# Patient Record
Sex: Female | Born: 1995 | Race: White | Hispanic: No | Marital: Single | Smoking: Never smoker
Health system: Southern US, Community
[De-identification: ages and names within clinical notes are randomized; demographics above are authoritative.]

## PROBLEM LIST (undated history)

## (undated) DIAGNOSIS — F329 Major depressive disorder, single episode, unspecified: Secondary | ICD-10-CM

## (undated) DIAGNOSIS — F909 Attention-deficit hyperactivity disorder, unspecified type: Secondary | ICD-10-CM

## (undated) DIAGNOSIS — F32A Depression, unspecified: Secondary | ICD-10-CM

## (undated) DIAGNOSIS — F4310 Post-traumatic stress disorder, unspecified: Secondary | ICD-10-CM

---
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
## 2017-09-25 ENCOUNTER — Encounter (HOSPITAL_COMMUNITY): Payer: Self-pay | Admitting: Nurse Practitioner

## 2017-09-25 ENCOUNTER — Emergency Department (HOSPITAL_COMMUNITY)
Admission: EM | Admit: 2017-09-25 | Discharge: 2017-09-25 | Disposition: A | Discharge status: Home / Self Care | Payer: Self-pay | Attending: Emergency Medicine | Admitting: Emergency Medicine

## 2017-09-25 DIAGNOSIS — R251 Tremor, unspecified: Secondary | ICD-10-CM | POA: Insufficient documentation

## 2017-09-25 LAB — COMPREHENSIVE METABOLIC PANEL
ALT: 13 U/L — AB (ref 14–54)
AST: 20 U/L (ref 15–41)
Albumin: 4.6 g/dL (ref 3.5–5.0)
Alkaline Phosphatase: 65 U/L (ref 38–126)
Anion gap: 7 (ref 5–15)
BILIRUBIN TOTAL: 0.6 mg/dL (ref 0.3–1.2)
BUN: 7 mg/dL (ref 6–20)
CHLORIDE: 106 mmol/L (ref 101–111)
CO2: 25 mmol/L (ref 22–32)
CREATININE: 0.69 mg/dL (ref 0.44–1.00)
Calcium: 9.3 mg/dL (ref 8.9–10.3)
Glucose, Bld: 84 mg/dL (ref 65–99)
Potassium: 3.9 mmol/L (ref 3.5–5.1)
SODIUM: 138 mmol/L (ref 135–145)
TOTAL PROTEIN: 7.8 g/dL (ref 6.5–8.1)

## 2017-09-25 LAB — CBC
HCT: 43.2 % (ref 36.0–46.0)
Hemoglobin: 14.6 g/dL (ref 12.0–15.0)
MCH: 31.3 pg (ref 26.0–34.0)
MCHC: 33.8 g/dL (ref 30.0–36.0)
MCV: 92.5 fL (ref 78.0–100.0)
PLATELETS: 210 10*3/uL (ref 150–400)
RBC: 4.67 MIL/uL (ref 3.87–5.11)
RDW: 12.9 % (ref 11.5–15.5)
WBC: 7.3 10*3/uL (ref 4.0–10.5)

## 2017-09-25 NOTE — ED Triage Notes (Signed)
Pt states she feels shaky especially her arms and her left side of her jaw. Denies any medical hx.

## 2017-09-25 NOTE — ED Provider Notes (Signed)
[facility]-EMERGENCY DEPT Provider Note   CSN: [id number] Arrival date & time: 09/25/17  1833     History   Chief Complaint Chief Complaint  Patient presents with  . Feels Shaky    HPI Alexandria DresserKristin [name] is a 21 y.o. female.  21 yo F with a chief complaint of feeling unwell.  The patient has had multiple episodes of this in the past.  They usually resolve spontaneously within about 20 minutes or so.  This 1 was more prolonged.  She suddenly felt bad felt like she could not breathe and started having pain to her left forearm and her left jaw.  This lasted for about an hour and then resolve.  She felt like she had some trouble speaking with it.  Does not lose consciousness with these.  She is unsure what seems to make that happen.  Once that happened when she was at a concert and was thought to be due to the flashing lights.  She denies recent head injury.  Denies fevers or chills.   The history is provided by the patient.  Illness  This is a recurrent problem. The current episode started more than 1 week ago. The problem occurs constantly. The problem has been resolved. Associated symptoms include shortness of breath. Pertinent negatives include no chest pain and no headaches. Nothing aggravates the symptoms. Nothing relieves the symptoms. She has tried nothing for the symptoms. The treatment provided no relief.    History reviewed. No pertinent past medical history.  There are no active problems to display for this patient.   History reviewed. No pertinent surgical history.  OB History    Gravida Para Term Preterm AB Living   1             SAB TAB Ectopic Multiple Live Births                   Home Medications    Prior to Admission medications   Not on File    Family History History reviewed. No pertinent family history.  Social History Social History   Tobacco Use  . Smoking status: Never Smoker  . Smokeless tobacco: Never Used  Substance  Use Topics  . Alcohol use: Yes    Frequency: Never  . Drug use: No     Allergies   Patient has no known allergies.   Review of Systems Review of Systems  Constitutional: Negative for chills and fever.  HENT: Negative for congestion and rhinorrhea.   Eyes: Negative for redness and visual disturbance.  Respiratory: Positive for shortness of breath. Negative for wheezing.   Cardiovascular: Negative for chest pain and palpitations.  Gastrointestinal: Negative for nausea and vomiting.  Genitourinary: Negative for dysuria and urgency.  Musculoskeletal: Positive for myalgias. Negative for arthralgias.  Skin: Negative for pallor and wound.  Neurological: Positive for weakness. Negative for dizziness and headaches.     Physical Exam Updated Vital Signs BP 134/79 (BP Location: Right Arm)   Pulse (!) 108   Temp 97.9 F (36.6 C) (Oral)   Resp 14   LMP 09/22/2017   SpO2 100%   Physical Exam  Constitutional: She is oriented to person, place, and time. She appears well-developed and well-nourished. No distress.  HENT:  Head: Normocephalic and atraumatic.  Eyes: EOM are normal. Pupils are equal, round, and reactive to light.  Neck: Normal range of motion. Neck supple.  Cardiovascular: Normal rate and regular rhythm. Exam reveals no gallop and no friction  rub.  No murmur heard. Pulmonary/Chest: Effort normal. She has no wheezes. She has no rales.  Abdominal: Soft. She exhibits no distension. There is no tenderness.  Musculoskeletal: She exhibits no edema or tenderness.  Neurological: She is alert and oriented to person, place, and time. She has normal strength. No cranial nerve deficit or sensory deficit. She displays a negative Romberg sign. Coordination and gait normal. GCS eye subscore is 4. GCS verbal subscore is 5. GCS motor subscore is 6.  Skin: Skin is warm and dry. She is not diaphoretic.  Psychiatric: She has a normal mood and affect. Her behavior is normal.  Nursing note  and vitals reviewed.    ED Treatments / Results  Labs (all labs ordered are listed, but only abnormal results are displayed) Labs Reviewed  COMPREHENSIVE METABOLIC PANEL - Abnormal; Notable for the following components:      Result Value   ALT 13 (*)    All other components within normal limits  CBC    EKG  EKG Interpretation None       Radiology No results found.  Procedures Procedures (including critical care time)  Medications Ordered in ED Medications - No data to display   Initial Impression / Assessment and Plan / ED Course  I have reviewed the triage vital signs and the nursing notes.  Pertinent labs & imaging results that were available during my care of the patient were reviewed by me and considered in my medical decision making (see chart for details).     21 yo F with a chief complaint of recurrent spells of feeling unwell.  Initially I thought this was most likely to be panic attacks.  She did have some left-sided this to her event this evening.  She also had an event that occurred with bright lights.  There is a possibility that these could be partial seizures.  I discussed this with the patient.  We will have her follow-up with neurology.  Discussed that she should not drive until cleared.  9:12 PM:  I have discussed the diagnosis/risks/treatment options with the patient and family and believe the pt to be eligible for discharge home to follow-up with PCP, Neuro. We also discussed returning to the ED immediately if new or worsening sx occur. We discussed the sx which are most concerning (e.g., repeat event) that necessitate immediate return. Medications administered to the patient during their visit and any new prescriptions provided to the patient are listed below.  Medications given during this visit Medications - No data to display   The patient appears reasonably screen and/or stabilized for discharge and I doubt any other medical condition or other  [facility] requiring further screening, evaluation, or treatment in the ED at this time prior to discharge.    Final Clinical Impressions(s) / ED Diagnoses   Final diagnoses:  Spells of trembling    ED Discharge Orders        Ordered    Ambulatory referral to Neurology    Comments:  Episodic shaking, ? Partial seizures   09/25/17 2105       Melene PlanFloyd, Ellisa Devivo, DO 09/25/17 2112

## 2017-09-29 ENCOUNTER — Encounter: Payer: Self-pay | Admitting: Neurology

## 2017-12-15 ENCOUNTER — Ambulatory Visit: Payer: Self-pay | Admitting: Neurology

## 2018-12-28 ENCOUNTER — Encounter (HOSPITAL_COMMUNITY): Payer: Self-pay | Admitting: Emergency Medicine

## 2018-12-28 ENCOUNTER — Other Ambulatory Visit: Payer: Self-pay

## 2018-12-28 ENCOUNTER — Emergency Department (HOSPITAL_COMMUNITY)
Admission: EM | Admit: 2018-12-28 | Discharge: 2018-12-28 | Disposition: A | Discharge status: Home / Self Care | Payer: BLUE CROSS/BLUE SHIELD | Attending: Emergency Medicine | Admitting: Emergency Medicine

## 2018-12-28 ENCOUNTER — Ambulatory Visit (HOSPITAL_COMMUNITY)
Admission: EM | Admit: 2018-12-28 | Discharge: 2018-12-28 | Disposition: A | Discharge status: Home / Self Care | Payer: BLUE CROSS/BLUE SHIELD | Source: Home / Self Care | Attending: Family Medicine | Admitting: Family Medicine

## 2018-12-28 DIAGNOSIS — R51 Headache: Secondary | ICD-10-CM | POA: Insufficient documentation

## 2018-12-28 DIAGNOSIS — R519 Headache, unspecified: Secondary | ICD-10-CM

## 2018-12-28 MED ORDER — DIPHENHYDRAMINE HCL 50 MG/ML IJ SOLN
25.0000 mg | Freq: Once | INTRAMUSCULAR | Status: AC
  Administered 2018-12-28: 25 mg via INTRAVENOUS
  Filled 2018-12-28: qty 1

## 2018-12-28 MED ORDER — ONDANSETRON 4 MG PO TBDP: 4.0000 mg | ORAL_TABLET | Freq: Three times a day (TID) | ORAL | 0 refills | Status: DC | PRN

## 2018-12-28 MED ORDER — SODIUM CHLORIDE 0.9 % IV SOLN
Freq: Once | INTRAVENOUS | Status: AC
Start: 2018-12-28 — End: 2018-12-28
  Administered 2018-12-28: 05:00:00 via INTRAVENOUS

## 2018-12-28 MED ORDER — KETOROLAC TROMETHAMINE 30 MG/ML IJ SOLN
30.0000 mg | Freq: Once | INTRAMUSCULAR | Status: AC
  Administered 2018-12-28: 30 mg via INTRAVENOUS
  Filled 2018-12-28: qty 1

## 2018-12-28 MED ORDER — METOCLOPRAMIDE HCL 5 MG/ML IJ SOLN
10.0000 mg | Freq: Once | INTRAMUSCULAR | Status: AC
  Administered 2018-12-28: 10 mg via INTRAVENOUS
  Filled 2018-12-28: qty 2

## 2018-12-28 MED ORDER — TRAMADOL HCL 50 MG PO TABS: 50.0000 mg | ORAL_TABLET | Freq: Four times a day (QID) | ORAL | 0 refills | Status: DC | PRN

## 2018-12-28 NOTE — ED Triage Notes (Signed)
Pt reports a migraine headache since Saturday 12/24/2018. Pt reports a history of same without prescription medication usage. Pt reports taking OTC meds without relief.

## 2018-12-28 NOTE — ED Triage Notes (Signed)
Pt here for continued headache after being treated in the ED with IV medications early this morning.  Pt states the headache has not fully gone away even after resting at home and taking Excedrin.    She states the ED told her to come here for further treatment if she was still having residual pain.

## 2018-12-28 NOTE — Discharge Instructions (Addendum)
Be aware, pain medications may cause drowsiness. Please do not drive, operate heavy machinery or make important decisions while on this medication, it can cloud your judgement.  Please seek prompt medical care if: You have: A very bad (severe) headache that is not helped by medicine. Trouble walking or weakness in your arms and legs. Clear or bloody fluid coming from your nose or ears. Changes in your seeing (vision). Jerky movements that you cannot control (seizure). You throw up (vomit). Your symptoms get worse. You lose balance. Your speech is slurred. You pass out. You are sleepier and have trouble staying awake. The black centers of your eyes (pupils) change in size.  These symptoms may be an emergency. Do not wait to see if the symptoms will go away. Get medical help right away. Call your local emergency services. Do not drive yourself to the hospital.

## 2018-12-28 NOTE — ED Provider Notes (Signed)
[facility] EMERGENCY DEPARTMENT Provider Note   CSN: [id number] Arrival date & time: 12/28/18  0335    History   Chief Complaint Chief Complaint  Patient presents with  . Migraine    HPI Alexandria [name] is a 23 y.o. female.     Patient presents to the emergency department with a chief complaint of headache.  She states that her headache started yesterday and has gradually worsened.  She states that she does have occasional migraines, and that this feels similar to those.  She complains of photophobia.  She has tried taking Aleve and Excedrin with mild relief.  She denies any fevers, chills, neck stiffness.  Denies numbness, weakness, or tingling.  Denies double vision or loss of vision.  Denies any other associated symptoms.  The history is provided by the patient. No language interpreter was used.    History reviewed. No pertinent past medical history.  There are no active problems to display for this patient.   History reviewed. No pertinent surgical history.   OB History    Gravida  1   Para      Term      Preterm      AB      Living        SAB      TAB      Ectopic      Multiple      Live Births               Home Medications    Prior to Admission medications   Not on File    Family History No family history on file.  Social History Social History   Tobacco Use  . Smoking status: Never Smoker  . Smokeless tobacco: Never Used  Substance Use Topics  . Alcohol use: Yes    Frequency: Never  . Drug use: Never     Allergies   Patient has no known allergies.   Review of Systems Review of Systems  All other systems reviewed and are negative.    Physical Exam Updated Vital Signs BP 103/87   Pulse (!) 109   Temp 99.8 F (37.7 C) (Oral)   Resp 18   Ht 5\' 3"  (1.6 m)   Wt 54 kg   LMP 12/19/2018 (Exact Date)   SpO2 96%   Breastfeeding Unknown   BMI 21.08 kg/m   Physical Exam Vitals signs and nursing  note reviewed.  Constitutional:      Appearance: She is well-developed.  HENT:     Head: Normocephalic and atraumatic.     Right Ear: External ear normal.     Left Ear: External ear normal.  Eyes:     Conjunctiva/sclera: Conjunctivae normal.     Pupils: Pupils are equal, round, and reactive to light.  Neck:     Musculoskeletal: Normal range of motion and neck supple.     Comments: No pain with neck flexion, no meningismus Cardiovascular:     Rate and Rhythm: Normal rate and regular rhythm.     Heart sounds: Normal heart sounds. No murmur. No friction rub. No gallop.   Pulmonary:     Effort: Pulmonary effort is normal. No respiratory distress.     Breath sounds: Normal breath sounds. No wheezing or rales.  Chest:     Chest wall: No tenderness.  Abdominal:     General: There is no distension.     Palpations: Abdomen is soft. There is no mass.  Tenderness: There is no abdominal tenderness. There is no guarding or rebound.  Musculoskeletal: Normal range of motion.        General: No tenderness.     Comments: Normal gait.  Skin:    General: Skin is warm and dry.  Neurological:     Mental Status: She is alert and oriented to person, place, and time.     Deep Tendon Reflexes: Reflexes are normal and symmetric.     Comments: CN 3-12 intact, normal finger to nose, no pronator drift, sensation and strength intact bilaterally.  Psychiatric:        Behavior: Behavior normal.        Thought Content: Thought content normal.        Judgment: Judgment normal.      ED Treatments / Results  Labs (all labs ordered are listed, but only abnormal results are displayed) Labs Reviewed - No data to display  EKG None  Radiology No results found.  Procedures Procedures (including critical care time)  Medications Ordered in ED Medications  ketorolac (TORADOL) 30 MG/ML injection 30 mg (has no administration in time range)  metoCLOPramide (REGLAN) injection 10 mg (has no  administration in time range)  diphenhydrAMINE (BENADRYL) injection 25 mg (has no administration in time range)  0.9 %  sodium chloride infusion (has no administration in time range)     Initial Impression / Assessment and Plan / ED Course  I have reviewed the triage vital signs and the nursing notes.  Pertinent labs & imaging results that were available during my care of the patient were reviewed by me and considered in my medical decision making (see chart for details).        Pt HA treated and improved while in ED.  Presentation is similar to patient's prior migraines and is not consistent with SAH, ICH, Meningitis, or temporal arteritis. Pt is afebrile with no focal neuro deficits, nuchal rigidity, or change in vision. Pt is to follow up with PCP to discuss prophylactic medication. Pt verbalizes understanding and is agreeable with plan to dc.     Final Clinical Impressions(s) / ED Diagnoses   Final diagnoses:  Acute nonintractable headache, unspecified headache type    ED Discharge Orders    None       Roxy Horseman, PA-C 12/28/18 0547    Zadie Rhine, MD 12/28/18 [phone]

## 2018-12-28 NOTE — ED Notes (Signed)
Patient verbalizes understanding of discharge instructions. Opportunity for questioning and answers were provided. Armband removed by staff, pt discharged from ED. Ambulated out to lobby  

## 2018-12-29 ENCOUNTER — Ambulatory Visit (INDEPENDENT_AMBULATORY_CARE_PROVIDER_SITE_OTHER)
Admission: EM | Admit: 2018-12-29 | Discharge: 2018-12-29 | Disposition: A | Discharge status: Home / Self Care | Payer: BLUE CROSS/BLUE SHIELD | Source: Home / Self Care | Attending: Family Medicine | Admitting: Family Medicine

## 2018-12-29 ENCOUNTER — Encounter (HOSPITAL_COMMUNITY): Payer: Self-pay | Admitting: *Deleted

## 2018-12-29 ENCOUNTER — Other Ambulatory Visit: Payer: Self-pay

## 2018-12-29 ENCOUNTER — Emergency Department (HOSPITAL_COMMUNITY)
Admission: EM | Admit: 2018-12-29 | Discharge: 2018-12-30 | Disposition: A | Discharge status: Home / Self Care | Payer: BLUE CROSS/BLUE SHIELD | Attending: Emergency Medicine | Admitting: Emergency Medicine

## 2018-12-29 ENCOUNTER — Encounter (HOSPITAL_COMMUNITY): Payer: Self-pay | Admitting: Emergency Medicine

## 2018-12-29 DIAGNOSIS — R5081 Fever presenting with conditions classified elsewhere: Secondary | ICD-10-CM | POA: Diagnosis not present

## 2018-12-29 DIAGNOSIS — R509 Fever, unspecified: Secondary | ICD-10-CM | POA: Diagnosis not present

## 2018-12-29 DIAGNOSIS — R519 Headache, unspecified: Secondary | ICD-10-CM

## 2018-12-29 DIAGNOSIS — R51 Headache: Secondary | ICD-10-CM

## 2018-12-29 DIAGNOSIS — M436 Torticollis: Secondary | ICD-10-CM | POA: Diagnosis not present

## 2018-12-29 DIAGNOSIS — G43109 Migraine with aura, not intractable, without status migrainosus: Secondary | ICD-10-CM | POA: Diagnosis not present

## 2018-12-29 DIAGNOSIS — B349 Viral infection, unspecified: Secondary | ICD-10-CM | POA: Insufficient documentation

## 2018-12-29 NOTE — Discharge Instructions (Signed)
Severe headache with neck stiffness and fever is a worrisome trio of symptoms.  You need to go to the emergency room to make sure that you do not have infection like meningitis

## 2018-12-29 NOTE — ED Triage Notes (Signed)
Pt c/o migraine since Saturday. Hx of the same. Reports she feels lightheaded and nauseated. Reports peripheral vision appears "shaky" and clear central vision. Pt says she does not think she has had a fever, reports she had temp of 100 at UC prior to coming here, denies tylenol or motrin. She was seen here yesterday for the same (headache and neck pain), felt better initially, but says that it started back again.

## 2018-12-29 NOTE — ED Triage Notes (Signed)
PT C/O: pt here for persistent HA onset 6 days.... seen here yest and also seen at [facility] ED for similar sx  Sts she was told to come back for IM injection for pain if it persisted.   Pain = 6/7  New sx today includes neck pain  A&O x4... NAD... Ambulatory

## 2018-12-29 NOTE — ED Provider Notes (Signed)
[facility]    CSN: [id number] Arrival date & time: 12/29/18  1924     History   Chief Complaint Chief Complaint  Patient presents with  . Headache    HPI Alexandria [name] is a 23 y.o. female.   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  Has infrequent migraines.  Has never required emergency visits or specific migraine prescription medication such as Imitrex.  Usually takes Advil She developed a headache on Saturday that got worsened over time.  She woke up at 2 AM yesterday with a severe headaches and went to the emergency room for care.  She received an injection of Toradol with Benadryl and her pain went from a "10" to a "4".  She went home and was able to sleep for a bit.  When she got up her headache was coming back so she came here to the urgent care center.  She was given tramadol and Zofran to manage her headaches at home and told to rest for a while further.  She has had 3 doses of tramadol, 2 doses of Zofran, and still has a severe headache.  Today in addition she has developed neck stiffness.  Her neck is very stiff and pain is much worse with looking downwards.  No runny or stuffy nose, no ear pressure pain, no sore throat.  No cough.  No other signs of infection.  Of note she is running a fever as she arrives today, she was unaware. States that she has had all of her immunizations including meningitis shots prior to coming to college She does feel like she is having a "bad year" with regards to infections.  She states she has had flu 2 or 3 times and 1 bacterial infection.  Not otherwise specified  History reviewed. No pertinent past medical history.  There are no active problems to display for this patient.   History reviewed. No pertinent surgical history.  OB History    Gravida  1   Para      Term      Preterm      AB      Living        SAB      TAB      Ectopic      Multiple      Live Births               Home  Medications    Prior to Admission medications   Medication Sig Start Date End Date Taking? Authorizing Provider  ondansetron (ZOFRAN-ODT) 4 MG disintegrating tablet Take 1 tablet (4 mg total) by mouth every 8 (eight) hours as needed for nausea or vomiting. 12/28/18  Yes Hagler, Arlys John, MD  traMADol (ULTRAM) 50 MG tablet Take 1 tablet (50 mg total) by mouth every 6 (six) hours as needed. 12/28/18  Yes Mardella Layman, MD    Family History History reviewed. No pertinent family history.  Social History Social History   Tobacco Use  . Smoking status: Never Smoker  . Smokeless tobacco: Never Used  Substance Use Topics  . Alcohol use: Yes    Frequency: Never  . Drug use: Never     Allergies   Patient has no known allergies.   Review of Systems Review of Systems  Constitutional: Positive for fever. Negative for chills.  HENT: Negative for ear pain and sore throat.   Eyes: Negative for photophobia, pain and visual disturbance.  Respiratory: Negative for cough and  shortness of breath.   Cardiovascular: Negative for chest pain and palpitations.  Gastrointestinal: Negative for abdominal pain, nausea and vomiting.  Genitourinary: Negative for dysuria and hematuria.  Musculoskeletal: Positive for neck pain and neck stiffness. Negative for arthralgias and back pain.  Skin: Negative for color change and rash.  Neurological: Positive for headaches. Negative for seizures and syncope.  All other systems reviewed and are negative.    Physical Exam Triage Vital Signs ED Triage Vitals  Enc Vitals Group     BP 12/29/18 2004 114/70     Pulse Rate 12/29/18 2004 (!) 101     Resp 12/29/18 2004 16     Temp 12/29/18 2004 100.3 F (37.9 C)     Temp Source 12/29/18 2004 Oral     SpO2 12/29/18 2004 97 %     Weight --      Height --      Head Circumference --      Peak Flow --      Pain Score 12/29/18 2006 6     Pain Loc --      Pain Edu? --      Excl. in GC? --    No data  found.  Updated Vital Signs BP 114/70 (BP Location: Left Arm)   Pulse (!) 101   Temp 100.3 F (37.9 C) (Oral)   Resp 16   LMP 12/19/2018 (Exact Date)   SpO2 97%   Breastfeeding No      Physical Exam Constitutional:      General: She is in acute distress.     Appearance: She is well-developed and normal weight. She is ill-appearing.  HENT:     Head: Normocephalic and atraumatic.     Mouth/Throat:     Mouth: Mucous membranes are moist.  Eyes:     Extraocular Movements: Extraocular movements intact.     Right eye: Normal extraocular motion and no nystagmus.     Left eye: Normal extraocular motion.     Conjunctiva/sclera: Conjunctivae normal.     Pupils: Pupils are equal, round, and reactive to light.  Neck:     Musculoskeletal: Normal range of motion.     Comments: Acute pain with neck flexion Cardiovascular:     Rate and Rhythm: Regular rhythm. Tachycardia present.  Pulmonary:     Effort: Pulmonary effort is normal. No respiratory distress.     Breath sounds: Normal breath sounds.  Abdominal:     General: Bowel sounds are normal. There is no distension.     Palpations: Abdomen is soft.  Musculoskeletal: Normal range of motion.  Lymphadenopathy:     Cervical: Cervical adenopathy present.  Skin:    General: Skin is warm and dry.  Neurological:     Mental Status: She is alert and oriented to person, place, and time.     Cranial Nerves: No dysarthria or facial asymmetry.     Sensory: No sensory deficit.     Motor: No weakness.     Coordination: Coordination normal.     Gait: Gait normal.     Deep Tendon Reflexes: Reflexes normal.  Psychiatric:        Mood and Affect: Mood normal.        Behavior: Behavior normal.      UC Treatments / Results  Labs (all labs ordered are listed, but only abnormal results are displayed) Labs Reviewed - No data to display  EKG None  Radiology No results found.  Procedures Procedures (including critical care  time)  Medications Ordered in UC Medications - No data to display  Initial Impression / Assessment and Plan / UC Course  I have reviewed the triage vital signs and the nursing notes.  Pertinent labs & imaging results that were available during my care of the patient were reviewed by me and considered in my medical decision making (see chart for details).     Patient has a severe headache, unremitting, with new onset of fever and neck stiffness.  Sending her to the emergency room to rule out meningitis. Final Clinical Impressions(s) / UC Diagnoses   Final diagnoses:  Bad headache  Fever, unspecified  Neck stiffness     Discharge Instructions     Severe headache with neck stiffness and fever is a worrisome trio of symptoms.  You need to go to the emergency room to make sure that you do not have infection like meningitis   ED Prescriptions    None     Controlled Substance Prescriptions [place] Controlled Substance Registry consulted? Not Applicable   Eustace Moore, MD 12/29/18 2040

## 2018-12-30 LAB — URINALYSIS, ROUTINE W REFLEX MICROSCOPIC
BILIRUBIN URINE: NEGATIVE
Glucose, UA: NEGATIVE mg/dL
KETONES UR: 20 mg/dL — AB
Nitrite: NEGATIVE
PH: 7 (ref 5.0–8.0)
PROTEIN: NEGATIVE mg/dL
Specific Gravity, Urine: 1.006 (ref 1.005–1.030)

## 2018-12-30 LAB — CSF CELL COUNT WITH DIFFERENTIAL
RBC Count, CSF: 0 /mm3
RBC Count, CSF: 0 /mm3
TUBE #: 1
Tube #: 4
WBC CSF: 0 /mm3 (ref 0–5)
WBC CSF: 2 /mm3 (ref 0–5)

## 2018-12-30 LAB — CBC WITH DIFFERENTIAL/PLATELET
ABS IMMATURE GRANULOCYTES: 0.01 10*3/uL (ref 0.00–0.07)
BASOS PCT: 0 %
Basophils Absolute: 0 10*3/uL (ref 0.0–0.1)
EOS PCT: 1 %
Eosinophils Absolute: 0 10*3/uL (ref 0.0–0.5)
HEMATOCRIT: 40.6 % (ref 36.0–46.0)
Hemoglobin: 13.6 g/dL (ref 12.0–15.0)
IMMATURE GRANULOCYTES: 0 %
LYMPHS PCT: 33 %
Lymphs Abs: 1.1 10*3/uL (ref 0.7–4.0)
MCH: 30.5 pg (ref 26.0–34.0)
MCHC: 33.5 g/dL (ref 30.0–36.0)
MCV: 91 fL (ref 80.0–100.0)
MONO ABS: 0.3 10*3/uL (ref 0.1–1.0)
MONOS PCT: 7 %
Neutro Abs: 2.1 10*3/uL (ref 1.7–7.7)
Neutrophils Relative %: 59 %
Platelets: 96 10*3/uL — ABNORMAL LOW (ref 150–400)
RBC: 4.46 MIL/uL (ref 3.87–5.11)
RDW: 11.9 % (ref 11.5–15.5)
Smear Review: ADEQUATE
WBC: 3.5 10*3/uL — ABNORMAL LOW (ref 4.0–10.5)
nRBC: 0 % (ref 0.0–0.2)

## 2018-12-30 LAB — INFLUENZA PANEL BY PCR (TYPE A & B)
INFLAPCR: NEGATIVE
Influenza B By PCR: NEGATIVE

## 2018-12-30 LAB — GRAM STAIN

## 2018-12-30 LAB — COMPREHENSIVE METABOLIC PANEL
ALT: 16 U/L (ref 0–44)
AST: 25 U/L (ref 15–41)
Albumin: 4 g/dL (ref 3.5–5.0)
Alkaline Phosphatase: 58 U/L (ref 38–126)
Anion gap: 9 (ref 5–15)
BILIRUBIN TOTAL: 0.5 mg/dL (ref 0.3–1.2)
CALCIUM: 8.9 mg/dL (ref 8.9–10.3)
CHLORIDE: 102 mmol/L (ref 98–111)
CO2: 23 mmol/L (ref 22–32)
CREATININE: 0.79 mg/dL (ref 0.44–1.00)
GFR calc non Af Amer: >60 mL/min (ref >60)
GLUCOSE: 98 mg/dL (ref 70–99)
Potassium: 3.5 mmol/L (ref 3.5–5.1)
Sodium: 134 mmol/L — ABNORMAL LOW (ref 135–145)
Total Protein: 6.7 g/dL (ref 6.5–8.1)

## 2018-12-30 LAB — POC URINE PREG, ED: Preg Test, Ur: NEGATIVE

## 2018-12-30 LAB — GLUCOSE, CSF: Glucose, CSF: 53 mg/dL (ref 40–70)

## 2018-12-30 LAB — LACTIC ACID, PLASMA
LACTIC ACID, VENOUS: 0.7 mmol/L (ref 0.5–1.9)
Lactic Acid, Venous: 0.9 mmol/L (ref 0.5–1.9)

## 2018-12-30 LAB — PROTEIN, CSF: Total  Protein, CSF: 17 mg/dL (ref 15–45)

## 2018-12-30 MED ORDER — ACETAMINOPHEN 500 MG PO TABS
1000.0000 mg | ORAL_TABLET | Freq: Once | ORAL | Status: AC
  Administered 2018-12-30: 1000 mg via ORAL
  Filled 2018-12-30: qty 2

## 2018-12-30 MED ORDER — PROCHLORPERAZINE EDISYLATE 10 MG/2ML IJ SOLN
10.0000 mg | Freq: Once | INTRAMUSCULAR | Status: AC
  Administered 2018-12-30: 10 mg via INTRAVENOUS
  Filled 2018-12-30: qty 2

## 2018-12-30 MED ORDER — SODIUM CHLORIDE 0.9 % IV SOLN
2.0000 g | Freq: Once | INTRAVENOUS | Status: DC
  Filled 2018-12-30: qty 20

## 2018-12-30 MED ORDER — SODIUM CHLORIDE 0.9 % IV BOLUS
1000.0000 mL | Freq: Once | INTRAVENOUS | Status: AC
  Administered 2018-12-30: 1000 mL via INTRAVENOUS

## 2018-12-30 MED ORDER — DIPHENHYDRAMINE HCL 50 MG/ML IJ SOLN
25.0000 mg | Freq: Once | INTRAMUSCULAR | Status: AC
Start: 2018-12-30 — End: 2018-12-30
  Administered 2018-12-30: 25 mg via INTRAVENOUS
  Filled 2018-12-30: qty 1

## 2018-12-30 MED ORDER — DEXAMETHASONE SODIUM PHOSPHATE 10 MG/ML IJ SOLN
10.0000 mg | Freq: Once | INTRAMUSCULAR | Status: AC
  Administered 2018-12-30: 10 mg via INTRAVENOUS
  Filled 2018-12-30: qty 1

## 2018-12-30 NOTE — ED Notes (Signed)
Procedure consent signed for lumbar puncture to be preformed by MD Cardama at bedside.

## 2018-12-30 NOTE — ED Notes (Signed)
Patient verbalizes understanding of medications and discharge instructions. No further questions at this time. VSS and patient ambulatory at discharge.   

## 2018-12-30 NOTE — ED Notes (Signed)
Patient ambulated to the bathroom with minimal assistance. Steady gait noted at this time.  

## 2018-12-30 NOTE — ED Provider Notes (Signed)
[facility] EMERGENCY DEPARTMENT Provider Note  CSN: [id number] Arrival date & time: 12/29/18 2034  Chief Complaint(s) Headache  HPI Alexandria [name] is a 23 y.o. female with a history of migraine headaches who presents to the emergency department with 6 days of persistent typical migraine headache which is been more severe and has lasted longer than usual.  She was seen here several days ago and treated with migraine cocktail which provided relief but not complete resolution.  She followed up with urgent care today who noted she had a low-grade temp with neck stiffness and instructed her to be evaluated here to rule out meningitis.  Patient endorses photophobia.  No other alleviating or aggravating factors.  Patient denies any known fevers at home.  Reports that she began having some nasal congestion several hours ago.  She endorses some nausea without emesis.  Denies any coughing, shortness of breath, or chest pain.  No abdominal pain.  No urinary symptoms.  No focal deficits.  No visual disturbance.  HPI  Past Medical History History reviewed. No pertinent past medical history. There are no active problems to display for this patient.  Home Medication(s) Prior to Admission medications   Medication Sig Start Date End Date Taking? Authorizing Provider  ondansetron (ZOFRAN-ODT) 4 MG disintegrating tablet Take 1 tablet (4 mg total) by mouth every 8 (eight) hours as needed for nausea or vomiting. 12/28/18   Mardella Layman, MD  traMADol (ULTRAM) 50 MG tablet Take 1 tablet (50 mg total) by mouth every 6 (six) hours as needed. 12/28/18   Mardella Layman, MD                                                                                                                                    Past Surgical History History reviewed. No pertinent surgical history. Family History No family history on file.  Social History Social History   Tobacco Use  . Smoking status: Never Smoker  .  Smokeless tobacco: Never Used  Substance Use Topics  . Alcohol use: Yes    Frequency: Never  . Drug use: Never   Allergies Patient has no known allergies.  Review of Systems Review of Systems All other systems are reviewed and are negative for acute change except as noted in the HPI  Physical Exam Vital Signs  I have reviewed the triage vital signs BP 106/66   Pulse 71   Temp 98.5 F (36.9 C) (Oral)   Resp 12   Ht 5\' 3"  (1.6 m)   Wt 54 kg   LMP 12/19/2018 (Exact Date)   SpO2 94%   BMI 21.08 kg/m   Physical Exam Vitals signs reviewed.  Constitutional:      General: She is not in acute distress.    Appearance: She is well-developed. She is not diaphoretic.  HENT:     Head: Normocephalic and atraumatic.     Nose: Nose normal.  Mouth/Throat:     Lips: No lesions.     Tongue: No lesions.     Pharynx: No pharyngeal swelling, oropharyngeal exudate or posterior oropharyngeal erythema.     Tonsils: No tonsillar exudate or tonsillar abscesses.  Eyes:     General: No scleral icterus.       Right eye: No discharge.        Left eye: No discharge.     Conjunctiva/sclera: Conjunctivae normal.     Pupils: Pupils are equal, round, and reactive to light.  Neck:     Musculoskeletal: Normal range of motion and neck supple. Pain with movement and muscular tenderness present. No spinous process tenderness.     Meningeal: Brudzinski's sign and Kernig's sign absent.  Cardiovascular:     Rate and Rhythm: Normal rate and regular rhythm.     Heart sounds: No murmur. No friction rub. No gallop.   Pulmonary:     Effort: Pulmonary effort is normal. No respiratory distress.     Breath sounds: Normal breath sounds. No stridor. No rales.  Abdominal:     General: There is no distension.     Palpations: Abdomen is soft.     Tenderness: There is no abdominal tenderness.  Musculoskeletal:        General: No tenderness.  Skin:    General: Skin is warm and dry.     Findings: No erythema  or rash.  Neurological:     Mental Status: She is alert and oriented to person, place, and time.     Comments: Mental Status:  Alert and oriented to person, place, and time.  Attention and concentration normal.  Speech clear.  Recent memory is intact  Cranial Nerves:  II Visual Fields: Intact to confrontation. Visual fields intact. III, IV, VI: Pupils equal and reactive to light and near. Full eye movement without nystagmus  V Facial Sensation: Normal. No weakness of masticatory muscles  VII: No facial weakness or asymmetry  VIII Auditory Acuity: Grossly normal  IX/X: The uvula is midline; the palate elevates symmetrically  XI: Normal sternocleidomastoid and trapezius strength  XII: The tongue is midline. No atrophy or fasciculations.   Motor System: Muscle Strength: 5/5 and symmetric in the upper and lower extremities. No pronation or drift.  Muscle Tone: Tone and muscle bulk are normal in the upper and lower extremities.   Reflexes: DTRs: 1+ and symmetrical in all four extremities. No Clonus Coordination: Intact finger-to-nose. No tremor.  Sensation: Intact to light touch. Gait: Routine  gait normal.      ED Results and Treatments Labs (all labs ordered are listed, but only abnormal results are displayed) Labs Reviewed  CBC WITH DIFFERENTIAL/PLATELET - Abnormal; Notable for the following components:      Result Value   WBC 3.5 (*)    Platelets 96 (*)    All other components within normal limits  COMPREHENSIVE METABOLIC PANEL - Abnormal; Notable for the following components:   Sodium 134 (*)    BUN <5 (*)    All other components within normal limits  URINALYSIS, ROUTINE W REFLEX MICROSCOPIC - Abnormal; Notable for the following components:   APPearance HAZY (*)    Hgb urine dipstick MODERATE (*)    Ketones, ur 20 (*)    Leukocytes,Ua TRACE (*)    Bacteria, UA RARE (*)    All other components within normal limits  GRAM STAIN  CSF CULTURE  LACTIC ACID, PLASMA    LACTIC ACID, PLASMA  CSF CELL COUNT  WITH DIFFERENTIAL  CSF CELL COUNT WITH DIFFERENTIAL  GLUCOSE, CSF  PROTEIN, CSF  INFLUENZA PANEL BY PCR (TYPE A & B)  POC URINE PREG, ED                                                                                                                         EKG  EKG Interpretation  Date/Time:    Ventricular Rate:    PR Interval:    QRS Duration:   QT Interval:    QTC Calculation:   R Axis:     Text Interpretation:        Radiology No results found. Pertinent labs & imaging results that were available during my care of the patient were reviewed by me and considered in my medical decision making (see chart for details).  Medications Ordered in ED Medications  sodium chloride 0.9 % bolus 1,000 mL (0 mLs Intravenous Stopped 12/30/18 0116)  diphenhydrAMINE (BENADRYL) injection 25 mg (25 mg Intravenous Given 12/30/18 0116)  dexamethasone (DECADRON) injection 10 mg (10 mg Intravenous Given 12/30/18 0116)  prochlorperazine (COMPAZINE) injection 10 mg (10 mg Intravenous Given 12/30/18 0116)  acetaminophen (TYLENOL) tablet 1,000 mg (1,000 mg Oral Given 12/30/18 0136)                                                                                                                                    Procedures .Lumbar Puncture Date/Time: 12/30/2018 2:46 AM Performed by: Nira Conn, MD Authorized by: Nira Conn, MD   Consent:    Consent obtained:  Verbal   Consent given by:  Patient   Risks discussed:  Infection, bleeding and headache   Alternatives discussed:  Delayed treatment Pre-procedure details:    Procedure purpose:  Diagnostic   Preparation: Patient was prepped and draped in usual sterile fashion   Anesthesia (see MAR for exact dosages):    Anesthesia method:  Local infiltration   Local anesthetic:  Lidocaine 1% w/o epi Procedure details:    Lumbar space:  L3-L4 interspace   Patient position:  L lateral decubitus    Needle gauge:  18   Needle type:  Spinal needle - Quincke tip   Needle length (in):  3.5   Ultrasound guidance: no     Number of attempts:  1   Opening pressure (cm H2O):  14   Fluid appearance:  Clear   Tubes of fluid:  4   Total volume (ml):  8 Post-procedure:    Puncture site:  Adhesive bandage applied   Patient tolerance of procedure:  Tolerated well, no immediate complications    (including critical care time)  Medical Decision Making / ED Course I have reviewed the nursing notes for this encounter and the patient's prior records (if available in EHR or on provided paperwork).    Typical migraine headache for the pt. Non focal neuro exam. No recent head trauma. Doubt intracranial bleed. Doubt IIH.   On review of records, urgent care note dated and documented temperature of 100.3.  Rectal temperature here was obtain and patient was noted to have a fever of 101.8. No indication for imaging.   We will move forward with labs and CSF analysis to rule out meningitis.  Will treat with migraine cocktail and reevaluate.  Migraine cocktail resulting in complete resolution of the patient's headache and neck pain.  This was prior to the LP.  However we will move forward with ruling out meningitis.  Patient declined empiric antibiotic until CSF analysis results.  Labs are reassuring without leukocytosis or anemia.  No significant electrolyte derangements or renal sufficiency.  Influenza negative.  CSF not consistent with meningitis.  Patient likely has a viral illness which resulting in exacerbation of her migraine headache.  Patient is now asymptomatic.  The patient appears reasonably screened and/or stabilized for discharge and I doubt any other medical condition or other [facility] requiring further screening, evaluation, or treatment in the ED at this time prior to discharge.  The patient is safe for discharge with strict return precautions.   Final Clinical Impression(s) / ED  Diagnoses Final diagnoses:  Fever in other diseases  Viral illness  Complicated migraine    Disposition: Discharge  Condition: Good  I have discussed the results, Dx and Tx plan with the patient who expressed understanding and agree(s) with the plan. Discharge instructions discussed at great length. The patient was given strict return precautions who verbalized understanding of the instructions. No further questions at time of discharge.    ED Discharge Orders    None       Follow Up: Medicine, [facility] [address] [place] [address] [phone]  Schedule an appointment as soon as possible for a visit  in 3-5 days, If symptoms do not improve or  worsen     This chart was dictated using voice recognition software.  Despite best efforts to proofread,  errors can occur which can change the documentation meaning.   Nira Conn, MD 12/30/18 [phone]

## 2019-01-02 LAB — CSF CULTURE W GRAM STAIN: Culture: NO GROWTH

## 2019-01-02 LAB — CSF CULTURE

## 2019-01-10 NOTE — ED Provider Notes (Signed)
[facility] CARE [facility]ENTER   [id number] 12/28/18 Arrival Time: 1633  ASSESSMENT & PLAN:  1. Acute intractable headache, unspecified headache type   2. Bad headache    Meds ordered this encounter  Medications  . ondansetron (ZOFRAN-ODT) 4 MG disintegrating tablet    Sig: Take 1 tablet (4 mg total) by mouth every 8 (eight) hours as needed for nausea or vomiting.    Dispense:  8 tablet    Refill:  0  . traMADol (ULTRAM) 50 MG tablet    Sig: Take 1 tablet (50 mg total) by mouth every 6 (six) hours as needed.    Dispense:  15 tablet    Refill:  0   Normal neurological exam. Discussed. Current presentation and symptoms are consistent with prior migraines and are not consistent with SAH, ICH, meningitis, or temporal arteritis. Without fever, focal neuro logical deficits, nuchal rigidity, or change in vision. No indication for neurodiagnostic workup at this time. Discussed.  Follow-up Information    Schedule an appointment as soon as possible for a visit  with Medicine, [facility].   Specialty:  Family Medicine Contact information: [address] [place] [address] [phone]        [facility].   Specialty:  Urgent Care Why:  If not improving overnight. Contact information: [address] [place] [address] [phone]         Reviewed expectations re: course of current medical issues. Questions answered. Outlined signs and symptoms indicating need for more acute intervention. Patient verbalized understanding. After Visit Summary given.   SUBJECTIVE:  Alexandria [name] is a 23 y.o. female who presents with complaint of a migraine headache. Treated in ED this morning. Notes reviewed. Mild improvement but feels headache is returning. Reports gradual worsening over the past few hours. Location: frontal without radiation. History of headaches: yes. Precipitating factors include: none which  have been determined. Associated symptoms: Preceding aura: no. Nausea/vomiting: yes, mild nausea without active emesis. Vision changes: no. Increased sensitivity to light and to noises: mild. Fever: no. Sinus pressure/congestion: no. Extremity weakness: no. Home treatment has included Excedrin with little improvement. Current headache has limited normal daily activities. Denies depression, dizziness, loss of balance, muscle weakness, numbness of extremities, speech difficulties and vision problems. No head injury reported. Ambulatory without difficulty.  ROS: As per HPI. All other systems negative.    OBJECTIVE:  Vitals:   12/28/18 1702  BP: 104/64  Temp: 99.7 F (37.6 C)  TempSrc: Temporal  SpO2: 100%    General appearance: alert; no distress but appears uncomfortable Eyes: PERRLA; EOMI; conjunctiva normal HENT: normocephalic; atraumatic Neck: supple with FROM Lungs: clear to auscultation bilaterally Heart: regular rate and rhythm Extremities: no edema; symmetrical with no gross deformities Skin: warm and dry Neurologic: CN 2-12 grossly intact; normal gait; normal symmetric reflexes; normal extremity strength and sensation throughout Psychological: alert and cooperative; normal mood and affect No Known Allergies  PMH: Migraines.  Social History   Socioeconomic History  . Marital status: Single    Spouse name: Not on file  . Number of children: Not on file  . Years of education: Not on file  . Highest education level: Not on file  Occupational History  . Not on file  Social Needs  . Financial resource strain: Not on file  . Food insecurity:    Worry: Not on file    Inability: Not on file  . Transportation needs:  Medical: Not on file    Non-medical: Not on file  Tobacco Use  . Smoking status: Never Smoker  . Smokeless tobacco: Never Used  Substance and Sexual Activity  . Alcohol use: Yes    Frequency: Never  . Drug use: Never  . Sexual activity: Yes     Birth control/protection: I.U.D., Condom  Lifestyle  . Physical activity:    Days per week: Not on file    Minutes per session: Not on file  . Stress: Not on file  Relationships  . Social connections:    Talks on phone: Not on file    Gets together: Not on file    Attends religious service: Not on file    Active member of club or organization: Not on file    Attends meetings of clubs or organizations: Not on file    Relationship status: Not on file  . Intimate partner violence:    Fear of current or ex partner: Not on file    Emotionally abused: Not on file    Physically abused: Not on file    Forced sexual activity: Not on file  Other Topics Concern  . Not on file  Social History Narrative  . Not on file   FH: Question of HTN.  History reviewed. No pertinent surgical history.   Mardella Layman, MD 01/16/19 [phone]

## 2019-07-29 ENCOUNTER — Encounter (HOSPITAL_COMMUNITY): Payer: Self-pay | Admitting: *Deleted

## 2019-07-29 ENCOUNTER — Other Ambulatory Visit: Payer: Self-pay

## 2019-07-29 ENCOUNTER — Emergency Department (HOSPITAL_COMMUNITY): Payer: BC Managed Care – PPO

## 2019-07-29 ENCOUNTER — Emergency Department (HOSPITAL_COMMUNITY)
Admission: EM | Admit: 2019-07-29 | Discharge: 2019-07-30 | Disposition: A | Discharge status: Other Acute Inpatient Hospital | Payer: BC Managed Care – PPO | Source: Home / Self Care | Attending: Emergency Medicine | Admitting: Emergency Medicine

## 2019-07-29 ENCOUNTER — Ambulatory Visit (HOSPITAL_COMMUNITY)
Admission: RE | Admit: 2019-07-29 | Discharge: 2019-07-29 | Disposition: A | Discharge status: Home / Self Care | Payer: BC Managed Care – PPO | Source: Home / Self Care | Attending: Psychiatry | Admitting: Psychiatry

## 2019-07-29 DIAGNOSIS — F329 Major depressive disorder, single episode, unspecified: Secondary | ICD-10-CM | POA: Insufficient documentation

## 2019-07-29 DIAGNOSIS — F29 Unspecified psychosis not due to a substance or known physiological condition: Secondary | ICD-10-CM | POA: Insufficient documentation

## 2019-07-29 DIAGNOSIS — Z20828 Contact with and (suspected) exposure to other viral communicable diseases: Secondary | ICD-10-CM | POA: Insufficient documentation

## 2019-07-29 DIAGNOSIS — Z79899 Other long term (current) drug therapy: Secondary | ICD-10-CM | POA: Insufficient documentation

## 2019-07-29 DIAGNOSIS — T1491XA Suicide attempt, initial encounter: Secondary | ICD-10-CM

## 2019-07-29 DIAGNOSIS — F332 Major depressive disorder, recurrent severe without psychotic features: Secondary | ICD-10-CM | POA: Diagnosis not present

## 2019-07-29 DIAGNOSIS — R45851 Suicidal ideations: Secondary | ICD-10-CM | POA: Insufficient documentation

## 2019-07-29 HISTORY — DX: Depression, unspecified: F32.A

## 2019-07-29 HISTORY — DX: Attention-deficit hyperactivity disorder, unspecified type: F90.9

## 2019-07-29 HISTORY — DX: Post-traumatic stress disorder, unspecified: F43.10

## 2019-07-29 LAB — COMPREHENSIVE METABOLIC PANEL
ALT: 13 U/L (ref 0–44)
AST: 18 U/L (ref 15–41)
Albumin: 5.3 g/dL — ABNORMAL HIGH (ref 3.5–5.0)
Alkaline Phosphatase: 64 U/L (ref 38–126)
Anion gap: 12 (ref 5–15)
BUN: 10 mg/dL (ref 6–20)
CO2: 23 mmol/L (ref 22–32)
Calcium: 9.6 mg/dL (ref 8.9–10.3)
Chloride: 106 mmol/L (ref 98–111)
Creatinine, Ser: 0.66 mg/dL (ref 0.44–1.00)
GFR calc Af Amer: >60 mL/min (ref >60)
GFR calc non Af Amer: >60 mL/min (ref >60)
Glucose, Bld: 92 mg/dL (ref 70–99)
Potassium: 4.1 mmol/L (ref 3.5–5.1)
Sodium: 141 mmol/L (ref 135–145)
Total Bilirubin: 0.6 mg/dL (ref 0.3–1.2)
Total Protein: 8.7 g/dL — ABNORMAL HIGH (ref 6.5–8.1)

## 2019-07-29 LAB — SALICYLATE LEVEL: Salicylate Lvl: <7 mg/dL (ref 2.8–30.0)

## 2019-07-29 LAB — I-STAT BETA HCG BLOOD, ED (MC, WL, AP ONLY): I-stat hCG, quantitative: <5 m[IU]/mL (ref <5)

## 2019-07-29 LAB — CBC
HCT: 47.8 % — ABNORMAL HIGH (ref 36.0–46.0)
Hemoglobin: 15.8 g/dL — ABNORMAL HIGH (ref 12.0–15.0)
MCH: 31.4 pg (ref 26.0–34.0)
MCHC: 33.1 g/dL (ref 30.0–36.0)
MCV: 95 fL (ref 80.0–100.0)
Platelets: 249 10*3/uL (ref 150–400)
RBC: 5.03 MIL/uL (ref 3.87–5.11)
RDW: 11.9 % (ref 11.5–15.5)
WBC: 9.1 10*3/uL (ref 4.0–10.5)
nRBC: 0 % (ref 0.0–0.2)

## 2019-07-29 LAB — ETHANOL: Alcohol, Ethyl (B): <10 mg/dL (ref <10)

## 2019-07-29 LAB — ACETAMINOPHEN LEVEL: Acetaminophen (Tylenol), Serum: <10 ug/mL — ABNORMAL LOW (ref 10–30)

## 2019-07-29 NOTE — ED Provider Notes (Signed)
[facility]-EMERGENCY DEPT Provider Note   CSN: [id number] Arrival date & time: 07/29/19  2021     History   Chief Complaint Chief Complaint  Patient presents with  . Medical Clearance    HPI Alexandria [name] is a 23 y.o. female.     Alexandria [name] is a 23 y.o. female with a history of depression, PTSD and ADHD, who presents to the ED from behavioral health for evaluation after suicide attempt.  Patient reports that last night around midnight she took 20 mg of Xanax and attempt to kill herself, this medication is not prescribed to her, but she took it from a friend.  She reports that her roommates came home at about 1:30 in the morning to find her sleeping on the bathroom floor.  They were unsure whether or not she hit her head but she was breathing so they let her continue to sleep.  Later today they tried to take her to behavioral health but she reported she was feeling sleepy and having difficulty walking and standing up straight so was sent to the ED for further evaluation.  She reports history of 4 previous concussions, denies current headache, vision changes, numbness, weakness or dizziness.  Does report some pain in the back of her neck.  No chest pain, shortness of breath.  No cough or fever.  No abdominal pain, nausea or vomiting.  She currently denies suicidal or homicidal ideations but does specifically state that she took Xanax in an attempt to kill herself, history of previous suicide attempts, most recently in March.     Past Medical History:  Diagnosis Date  . ADHD   . Depression   . PTSD (post-traumatic stress disorder)     There are no active problems to display for this patient.   No past surgical history on file.   OB History    Gravida  1   Para      Term      Preterm      AB      Living        SAB      TAB      Ectopic      Multiple      Live Births               Home Medications    Prior to Admission  medications   Medication Sig Start Date End Date Taking? Authorizing Provider  ALPRAZolam Prudy Feeler) 1 MG tablet Take 20 mg by mouth once.   Yes [provider]  ondansetron (ZOFRAN-ODT) 4 MG disintegrating tablet Take 1 tablet (4 mg total) by mouth every 8 (eight) hours as needed for nausea or vomiting. Patient not taking: Reported on 07/29/2019 12/28/18   Mardella Layman, MD  traMADol (ULTRAM) 50 MG tablet Take 1 tablet (50 mg total) by mouth every 6 (six) hours as needed. Patient not taking: Reported on 07/29/2019 12/28/18   Mardella Layman, MD    Family History No family history on file.  Social History Social History   Tobacco Use  . Smoking status: Never Smoker  . Smokeless tobacco: Never Used  Substance Use Topics  . Alcohol use: Yes    Frequency: Never  . Drug use: Never     Allergies   Patient has no known allergies.   Review of Systems Review of Systems  Constitutional: Negative for chills and fever.  HENT: Negative.   Eyes: Negative for visual disturbance.  Respiratory: Negative for cough  and shortness of breath.   Cardiovascular: Negative for chest pain.  Gastrointestinal: Negative for abdominal pain, nausea and vomiting.  Genitourinary: Negative for dysuria.  Musculoskeletal: Negative for arthralgias and myalgias.  Skin: Negative for color change and rash.  Neurological: Negative for dizziness, syncope, facial asymmetry, speech difficulty, weakness, light-headedness, numbness and headaches.  All other systems reviewed and are negative.    Physical Exam Updated Vital Signs BP 117/78 (BP Location: Right Arm)   Pulse 87   Temp 98.8 F (37.1 C) (Oral)   Resp 16   LMP 07/24/2019   SpO2 100%   Physical Exam Vitals signs and nursing note reviewed.  Constitutional:      General: She is not in acute distress.    Appearance: Normal appearance. She is well-developed and normal weight. She is not ill-appearing or diaphoretic.  HENT:     Head: Normocephalic  and atraumatic.     Mouth/Throat:     Mouth: Mucous membranes are moist.     Pharynx: Oropharynx is clear.  Eyes:     General:        Right eye: No discharge.        Left eye: No discharge.     Extraocular Movements: Extraocular movements intact.     Pupils: Pupils are equal, round, and reactive to light.  Neck:     Musculoskeletal: Neck supple.     Comments: There is some tenderness over the midline C-spine but no palpable deformity Cardiovascular:     Rate and Rhythm: Normal rate and regular rhythm.     Heart sounds: Normal heart sounds. No murmur. No friction rub. No gallop.   Pulmonary:     Effort: Pulmonary effort is normal. No respiratory distress.     Breath sounds: Normal breath sounds. No wheezing or rales.     Comments: Respirations equal and unlabored, patient able to speak in full sentences, lungs clear to auscultation bilaterally Abdominal:     General: Bowel sounds are normal. There is no distension.     Palpations: Abdomen is soft. There is no mass.     Tenderness: There is no abdominal tenderness. There is no guarding.     Comments: Abdomen soft, nondistended, nontender to palpation in all quadrants without guarding or peritoneal signs  Musculoskeletal:        General: No deformity.     Right lower leg: No edema.     Left lower leg: No edema.  Skin:    General: Skin is warm and dry.     Capillary Refill: Capillary refill takes less than 2 seconds.  Neurological:     Mental Status: She is alert.     Coordination: Coordination normal.     Comments: Speech is clear, able to follow commands CN III-XII intact Normal strength in upper and lower extremities bilaterally including dorsiflexion and plantar flexion, strong and equal grip strength Sensation normal to light and sharp touch Moves extremities without ataxia, coordination intact  Psychiatric:        Attention and Perception: She does not perceive auditory or visual hallucinations.        Mood and Affect:  Mood is depressed. Affect is flat.        Speech: Speech normal.        Behavior: Behavior normal. Behavior is cooperative.        Thought Content: Thought content includes suicidal ideation. Thought content does not include homicidal ideation. Thought content includes suicidal plan.      ED  Treatments / Results  Labs (all labs ordered are listed, but only abnormal results are displayed) Labs Reviewed  COMPREHENSIVE METABOLIC PANEL - Abnormal; Notable for the following components:      Result Value   Total Protein 8.7 (*)    Albumin 5.3 (*)    All other components within normal limits  ACETAMINOPHEN LEVEL - Abnormal; Notable for the following components:   Acetaminophen (Tylenol), Serum <10 (*)    All other components within normal limits  CBC - Abnormal; Notable for the following components:   Hemoglobin 15.8 (*)    HCT 47.8 (*)    All other components within normal limits  RAPID URINE DRUG SCREEN, HOSP PERFORMED - Abnormal; Notable for the following components:   Benzodiazepines POSITIVE (*)    All other components within normal limits  ETHANOL  SALICYLATE LEVEL  I-STAT BETA HCG BLOOD, ED ([facility], [facility], [facility] ONLY)    EKG None  Radiology Ct Head Wo Contrast  Result Date: 07/29/2019 CLINICAL DATA:  Attempted suicide, possible fall EXAM: CT HEAD WITHOUT CONTRAST CT CERVICAL SPINE WITHOUT CONTRAST TECHNIQUE: Multidetector CT imaging of the head and cervical spine was performed following the standard protocol without intravenous contrast. Multiplanar CT image reconstructions of the cervical spine were also generated. COMPARISON:  None. FINDINGS: CT HEAD FINDINGS Brain: No acute territorial infarction, hemorrhage, or intracranial mass. Coarse calcification within the left cerebellar hemisphere without associated mass or mass effect. Normal ventricle size Vascular: No hyperdense vessels.  No unexpected calcification Skull: Normal. Negative for fracture or focal lesion. Sinuses/Orbits: No  acute finding. Other: None CT CERVICAL SPINE FINDINGS Alignment: Mild reversal of cervical lordosis. No subluxation. Facet alignment within normal limits. Skull base and vertebrae: No acute fracture. No primary bone lesion or focal pathologic process. Incomplete fusion posterior arch of C1. Soft tissues and spinal canal: No prevertebral fluid or swelling. No visible canal hematoma. Disc levels:  Within normal limits Upper chest: Negative. Other: Subcentimeter hypodense nodule right lobe of thyroid IMPRESSION: 1. No CT evidence for acute intracranial abnormality. Coarse dystrophic appearing calcification left cerebellar hemisphere possibly due to remote insult. No associated mass or mass effect. 2. Mild reversal of cervical lordosis.  No acute osseous abnormality Electronically Signed   By: Jasmine Pang M.D.   On: 07/29/2019 23:51   Ct Cervical Spine Wo Contrast  Result Date: 07/29/2019 CLINICAL DATA:  Attempted suicide, possible fall EXAM: CT HEAD WITHOUT CONTRAST CT CERVICAL SPINE WITHOUT CONTRAST TECHNIQUE: Multidetector CT imaging of the head and cervical spine was performed following the standard protocol without intravenous contrast. Multiplanar CT image reconstructions of the cervical spine were also generated. COMPARISON:  None. FINDINGS: CT HEAD FINDINGS Brain: No acute territorial infarction, hemorrhage, or intracranial mass. Coarse calcification within the left cerebellar hemisphere without associated mass or mass effect. Normal ventricle size Vascular: No hyperdense vessels.  No unexpected calcification Skull: Normal. Negative for fracture or focal lesion. Sinuses/Orbits: No acute finding. Other: None CT CERVICAL SPINE FINDINGS Alignment: Mild reversal of cervical lordosis. No subluxation. Facet alignment within normal limits. Skull base and vertebrae: No acute fracture. No primary bone lesion or focal pathologic process. Incomplete fusion posterior arch of C1. Soft tissues and spinal canal: No  prevertebral fluid or swelling. No visible canal hematoma. Disc levels:  Within normal limits Upper chest: Negative. Other: Subcentimeter hypodense nodule right lobe of thyroid IMPRESSION: 1. No CT evidence for acute intracranial abnormality. Coarse dystrophic appearing calcification left cerebellar hemisphere possibly due to remote insult. No associated mass or  mass effect. 2. Mild reversal of cervical lordosis.  No acute osseous abnormality Electronically Signed   By: Donavan Foil M.D.   On: 07/29/2019 23:51    Procedures Procedures (including critical care time)  Medications Ordered in ED Medications  acetaminophen (TYLENOL) tablet 650 mg (has no administration in time range)     Initial Impression / Assessment and Plan / ED Course  I have reviewed the triage vital signs and the nursing notes.  Pertinent labs & imaging results that were available during my care of the patient were reviewed by me and considered in my medical decision making (see chart for details).  23 year old female presents for evaluation after she took 20 mg of Xanax and attempt to commit suicide, history of previous suicide attempts as well.  Currently denies SI, HI or AVH.  Patient unsure if she hit her head on the bathroom floor but has felt intermittently tired and had trouble standing up earlier, so was sent here for further evaluation when friends tried to take her to behavioral health.  Here she has a normal neurologic exam and has been ambulatory in the department with steady gait without assistance.  She does have some C-spine tenderness.  CTs of the head and cervical spine ordered, and show no acute intracranial injury or traumatic fracture or malalignment.  Medical clearance lab work has been reassuring, no leukocytosis, no electrolyte derangements, normal renal and liver function.  Aside from benzos, UDS negative and tox labs negative.  No other focal findings on exam, at this time patient is medically cleared,  has been placed under ED psych hold pending TTS evaluation for appropriate disposition.  Final Clinical Impressions(s) / ED Diagnoses   Final diagnoses:  Suicide attempt [facility])    ED Discharge Orders    None       Jacqlyn Larsen, [place] 07/30/19 0041    Drenda Freeze, MD 07/30/19 [phone]

## 2019-07-29 NOTE — Progress Notes (Signed)
Received Alexandria [name] from the main ED, she immediately went to bed. Pharmacy in to review her medications. Later she went for her CT scan  in her bed. She spoke with TTS and a Covid test was done. She is scheduled to be transferred to [facility] after 0800 hrs this morning, 07/30/2019.

## 2019-07-29 NOTE — ED Notes (Signed)
Pt ambulatory to TCU with steady gait. Pt belongings removed, pt in scrubs, wanded by security. Report given to [facility].

## 2019-07-29 NOTE — ED Triage Notes (Signed)
Pt reports that she took 20mg  xanax last night around midnight in attempts to kill herself. She says her friends found her on the floor in the bathroom, unsure if she hit her head or not. Went to [facility] for eval, said she is having difficulty walking and standing up straight. Hx of depression, anxiety, PTSD and ADHD. Tried to hang herself in March in an SI attempt.

## 2019-07-29 NOTE — ED Triage Notes (Signed)
Pt arrives via EMS from [facility]. Pt went to [facility] today after she took 20mg  of xanax last night in SI attempt. [place] sent her here for further eval because she "is unsteady". 108/70, hr 94, R 18, 99% RA, 97.2.

## 2019-07-30 ENCOUNTER — Inpatient Hospital Stay (HOSPITAL_COMMUNITY)
Admission: AD | Admit: 2019-07-30 | Discharge: 2019-08-02 | DRG: 885 | Disposition: A | Discharge status: Home / Self Care | Payer: BC Managed Care – PPO | Source: Intra-hospital | Attending: Psychiatry | Admitting: Psychiatry

## 2019-07-30 ENCOUNTER — Encounter (HOSPITAL_COMMUNITY): Payer: Self-pay | Admitting: Behavioral Health

## 2019-07-30 ENCOUNTER — Other Ambulatory Visit: Payer: Self-pay

## 2019-07-30 DIAGNOSIS — F411 Generalized anxiety disorder: Secondary | ICD-10-CM | POA: Diagnosis present

## 2019-07-30 DIAGNOSIS — F4310 Post-traumatic stress disorder, unspecified: Secondary | ICD-10-CM | POA: Diagnosis present

## 2019-07-30 DIAGNOSIS — Z818 Family history of other mental and behavioral disorders: Secondary | ICD-10-CM | POA: Diagnosis not present

## 2019-07-30 DIAGNOSIS — F410 Panic disorder [episodic paroxysmal anxiety] without agoraphobia: Secondary | ICD-10-CM | POA: Diagnosis present

## 2019-07-30 DIAGNOSIS — G4710 Hypersomnia, unspecified: Secondary | ICD-10-CM | POA: Diagnosis present

## 2019-07-30 DIAGNOSIS — G4700 Insomnia, unspecified: Secondary | ICD-10-CM | POA: Diagnosis present

## 2019-07-30 DIAGNOSIS — Z62810 Personal history of physical and sexual abuse in childhood: Secondary | ICD-10-CM | POA: Diagnosis present

## 2019-07-30 DIAGNOSIS — F3342 Major depressive disorder, recurrent, in full remission: Secondary | ICD-10-CM | POA: Diagnosis present

## 2019-07-30 DIAGNOSIS — F330 Major depressive disorder, recurrent, mild: Secondary | ICD-10-CM | POA: Diagnosis present

## 2019-07-30 DIAGNOSIS — Z20828 Contact with and (suspected) exposure to other viral communicable diseases: Secondary | ICD-10-CM | POA: Diagnosis present

## 2019-07-30 DIAGNOSIS — T424X2A Poisoning by benzodiazepines, intentional self-harm, initial encounter: Secondary | ICD-10-CM

## 2019-07-30 DIAGNOSIS — F332 Major depressive disorder, recurrent severe without psychotic features: Secondary | ICD-10-CM | POA: Diagnosis present

## 2019-07-30 DIAGNOSIS — Z79899 Other long term (current) drug therapy: Secondary | ICD-10-CM | POA: Diagnosis not present

## 2019-07-30 DIAGNOSIS — Z915 Personal history of self-harm: Secondary | ICD-10-CM

## 2019-07-30 LAB — RAPID URINE DRUG SCREEN, HOSP PERFORMED
Amphetamines: NOT DETECTED
Barbiturates: NOT DETECTED
Benzodiazepines: POSITIVE — AB
Cocaine: NOT DETECTED
Opiates: NOT DETECTED
Tetrahydrocannabinol: NOT DETECTED

## 2019-07-30 LAB — SARS CORONAVIRUS 2 BY RT PCR (HOSPITAL ORDER, PERFORMED IN ~~LOC~~ HOSPITAL LAB): SARS Coronavirus 2: NEGATIVE

## 2019-07-30 MED ORDER — HYDROXYZINE HCL 25 MG PO TABS: 25.0000 mg | ORAL_TABLET | Freq: Three times a day (TID) | ORAL | Status: DC | PRN

## 2019-07-30 MED ORDER — TRAZODONE HCL 50 MG PO TABS
50.0000 mg | ORAL_TABLET | Freq: Every evening | ORAL | Status: DC | PRN
  Administered 2019-08-01: 22:00:00 50 mg via ORAL
  Filled 2019-07-30 (×2): qty 1

## 2019-07-30 MED ORDER — SERTRALINE HCL 50 MG PO TABS
50.0000 mg | ORAL_TABLET | Freq: Every day | ORAL | Status: DC
  Administered 2019-07-30 – 2019-08-01 (×3): 50 mg via ORAL
  Filled 2019-07-30 (×6): qty 1

## 2019-07-30 MED ORDER — ACETAMINOPHEN 325 MG PO TABS: 650.0000 mg | ORAL_TABLET | Freq: Four times a day (QID) | ORAL | Status: DC | PRN

## 2019-07-30 MED ORDER — HYDROXYZINE HCL 10 MG PO TABS
10.0000 mg | ORAL_TABLET | Freq: Three times a day (TID) | ORAL | Status: DC | PRN
  Administered 2019-07-30 – 2019-08-01 (×5): 10 mg via ORAL
  Filled 2019-07-30 (×5): qty 1

## 2019-07-30 MED ORDER — ALUM & MAG HYDROXIDE-SIMETH 200-200-20 MG/5ML PO SUSP: 30.0000 mL | ORAL | Status: DC | PRN

## 2019-07-30 MED ORDER — LORAZEPAM 1 MG PO TABS
1.0000 mg | ORAL_TABLET | Freq: Four times a day (QID) | ORAL | Status: AC | PRN
  Administered 2019-07-30 – 2019-08-01 (×3): 1 mg via ORAL
  Filled 2019-07-30 (×3): qty 1

## 2019-07-30 MED ORDER — ENSURE ENLIVE PO LIQD
237.0000 mL | Freq: Two times a day (BID) | ORAL | Status: DC
  Administered 2019-07-30: 13:00:00 237 mL via ORAL

## 2019-07-30 MED ORDER — MAGNESIUM HYDROXIDE 400 MG/5ML PO SUSP: 30.0000 mL | Freq: Every day | ORAL | Status: DC | PRN

## 2019-07-30 MED ORDER — ACETAMINOPHEN 325 MG PO TABS
650.0000 mg | ORAL_TABLET | ORAL | Status: DC | PRN
  Administered 2019-07-30: 650 mg via ORAL
  Filled 2019-07-30: qty 2
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
## 2019-07-30 NOTE — Tx Team (Signed)
Initial Treatment Plan 07/30/2019 10:38 AM Higinio Plan [id number]    PATIENT STRESSORS: Educational concerns Traumatic event   PATIENT STRENGTHS: Ability for insight Active sense of humor Average or above average intelligence   PATIENT IDENTIFIED PROBLEMS: "school"  "completing ADL's"                   DISCHARGE CRITERIA:  Ability to meet basic life and health needs Adequate post-discharge living arrangements Improved stabilization in mood, thinking, and/or behavior  PRELIMINARY DISCHARGE PLAN: Attend aftercare/continuing care group Attend PHP/IOP  PATIENT/FAMILY INVOLVEMENT: This treatment plan has been presented to and reviewed with the patient, Leinaala Catanese, and/or family member.  The patient and family have been given the opportunity to ask questions and make suggestions.  Marissa Calamity, RN 07/30/2019, 10:38 AM
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
## 2019-07-30 NOTE — Plan of Care (Signed)
Nurse discussed anxiety, depression and coping skills with patient.  

## 2019-07-30 NOTE — Progress Notes (Signed)
D.  Pt pleasant but guarded on approach, complaint of anxiety.  Pt was observed up in dayroom and minimally but appropriately interacting with peers on the unit.  Pt denies SI/HI/AVH at this time.  A.  Support and encouragement offered, medication given as ordered  R.  Pt remains safe on the unit, will continue to monitor

## 2019-07-30 NOTE — Progress Notes (Signed)
D:  Patient denied SI and HI, contracts for safety.  Denied A/V hallucinations.  Denied pain. A:  Medications administered per MD orders.  Emotional support and encouragement given patient. R:  Safety maintained with 15 minute checks.  

## 2019-07-30 NOTE — Progress Notes (Signed)
Pt admitted to the adult unit from [facility]. The pt presented with a flat affect and depressed mood. Pt noted to be cautious during the admission process and forwarded little information. The pt expressed that she ingested 20 mg of xanax in a suicide attempt after she received an upsetting message from a friend. Pt also reported feeling stressed due to failing her classes this semester. Pt reported a previous suicide attempt in March, 2020. Pt denied taking any psych medications at this time. Pt stated that her physician recommended she take Celexa, but she declined because she feels like her depression is secondary to her current problems and having ADHD. Pt denied SI/HI. Pt denied AVH. Pt safely brought onto the unit and oriented to the environment. Report given to receiving nurse Rise Paganini, RN.

## 2019-07-30 NOTE — ED Notes (Signed)
Pt DC d off unit to [facility] per provider. Pt VOL. Pt alert, calm, cooperative, no s/s of distress. Pt DC information given to [facility] for facility. Belongings given to [facility] for facility  Pt ambulatory off unit, escorted by [facility]. Pt transported by [employer]
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
## 2019-07-30 NOTE — Progress Notes (Signed)
[facility] Group Notes:  (Nursing/MHT/Case Management/Adjunct)  Date:  07/30/2019  Time:  1330  Type of Therapy:  Nurse Education  Participation Level: Did not attend.  Summary of Progress/Problems: Discussed healthy support systems   Richel Millspaugh L  

## 2019-07-31 NOTE — Tx Team (Signed)
Interdisciplinary Treatment and Diagnostic Plan Update  07/31/2019 Time of Session: 9:00am Alexandria [name] MRN: [id number]  Principal Diagnosis: <principal problem not specified>  Secondary Diagnoses: Active Problems:   Severe recurrent major depression without psychotic features (HCC)   Current Medications:  Current Facility-Administered Medications  Medication Dose Route Frequency Provider Last Rate Last Dose  . acetaminophen (TYLENOL) tablet 650 mg  650 mg Oral Q6H PRN Lindon Romp A, NP      . alum & mag hydroxide-simeth (MAALOX/MYLANTA) 200-200-20 MG/5ML suspension 30 mL  30 mL Oral Q4H PRN Lindon Romp A, NP      . feeding supplement (ENSURE ENLIVE) (ENSURE ENLIVE) liquid 237 mL  237 mL Oral BID BM Cobos, Myer Peer, MD   237 mL at 07/30/19 1315  . hydrOXYzine (ATARAX/VISTARIL) tablet 10 mg  10 mg Oral TID PRN Cobos, Myer Peer, MD   10 mg at 07/30/19 1857  . LORazepam (ATIVAN) tablet 1 mg  1 mg Oral Q6H PRN Lindon Romp A, NP   1 mg at 07/30/19 2246  . magnesium hydroxide (MILK OF MAGNESIA) suspension 30 mL  30 mL Oral Daily PRN Lindon Romp A, NP      . sertraline (ZOLOFT) tablet 50 mg  50 mg Oral Daily Cobos, Myer Peer, MD   50 mg at 07/31/19 0743  . traZODone (DESYREL) tablet 50 mg  50 mg Oral QHS PRN Rozetta Nunnery, NP       PTA Medications: Medications Prior to Admission  Medication Sig Dispense Refill Last Dose  . ALPRAZolam (XANAX) 1 MG tablet Take 20 mg by mouth once.     . ondansetron (ZOFRAN-ODT) 4 MG disintegrating tablet Take 1 tablet (4 mg total) by mouth every 8 (eight) hours as needed for nausea or vomiting. (Patient not taking: Reported on 07/29/2019) 8 tablet 0   . traMADol (ULTRAM) 50 MG tablet Take 1 tablet (50 mg total) by mouth every 6 (six) hours as needed. (Patient not taking: Reported on 07/29/2019) 15 tablet 0     Patient Stressors: Educational concerns Traumatic event  Patient Strengths: Ability for insight Active sense of humor Average or above  average intelligence  Treatment Modalities: Medication Management, Group therapy, Case management,  1 to 1 session with clinician, Psychoeducation, Recreational therapy.   Physician Treatment Plan for Primary Diagnosis: <principal problem not specified> Long Term Goal(s): Improvement in symptoms so as ready for discharge Improvement in symptoms so as ready for discharge   Short Term Goals: Ability to identify changes in lifestyle to reduce recurrence of condition will improve Ability to verbalize feelings will improve Ability to disclose and discuss suicidal ideas Ability to demonstrate self-control will improve Ability to identify and develop effective coping behaviors will improve Ability to maintain clinical measurements within normal limits will improve Compliance with prescribed medications will improve Ability to verbalize feelings will improve Ability to disclose and discuss suicidal ideas Ability to demonstrate self-control will improve Ability to identify and develop effective coping behaviors will improve Ability to maintain clinical measurements within normal limits will improve  Medication Management: Evaluate patient's response, side effects, and tolerance of medication regimen.  Therapeutic Interventions: 1 to 1 sessions, Unit Group sessions and Medication administration.  Evaluation of Outcomes: Not Met  Physician Treatment Plan for Secondary Diagnosis: Active Problems:   Severe recurrent major depression without psychotic features ([place])  Long Term Goal(s): Improvement in symptoms so as ready for discharge Improvement in symptoms so as ready for discharge   Short Term Goals: Ability to  identify changes in lifestyle to reduce recurrence of condition will improve Ability to verbalize feelings will improve Ability to disclose and discuss suicidal ideas Ability to demonstrate self-control will improve Ability to identify and develop effective coping behaviors will  improve Ability to maintain clinical measurements within normal limits will improve Compliance with prescribed medications will improve Ability to verbalize feelings will improve Ability to disclose and discuss suicidal ideas Ability to demonstrate self-control will improve Ability to identify and develop effective coping behaviors will improve Ability to maintain clinical measurements within normal limits will improve     Medication Management: Evaluate patient's response, side effects, and tolerance of medication regimen.  Therapeutic Interventions: 1 to 1 sessions, Unit Group sessions and Medication administration.  Evaluation of Outcomes: Not Met   RN Treatment Plan for Primary Diagnosis: <principal problem not specified> Long Term Goal(s): Knowledge of disease and therapeutic regimen to maintain health will improve  Short Term Goals: Ability to demonstrate self-control, Ability to participate in decision making will improve, Ability to verbalize feelings will improve and Ability to identify and develop effective coping behaviors will improve  Medication Management: RN will administer medications as ordered by provider, will assess and evaluate patient's response and provide education to patient for prescribed medication. RN will report any adverse and/or side effects to prescribing provider.  Therapeutic Interventions: 1 on 1 counseling sessions, Psychoeducation, Medication administration, Evaluate responses to treatment, Monitor vital signs and CBGs as ordered, Perform/monitor CIWA, COWS, AIMS and Fall Risk screenings as ordered, Perform wound care treatments as ordered.  Evaluation of Outcomes: Not Met   LCSW Treatment Plan for Primary Diagnosis: <principal problem not specified> Long Term Goal(s): Safe transition to appropriate next level of care at discharge, Engage patient in therapeutic group addressing interpersonal concerns.  Short Term Goals: Engage patient in aftercare  planning with referrals and resources, Increase social support, Increase emotional regulation, Identify triggers associated with mental health/substance abuse issues and Increase skills for wellness and recovery  Therapeutic Interventions: Assess for all discharge needs, 1 to 1 time with Social worker, Explore available resources and support systems, Assess for adequacy in community support network, Educate family and significant other(s) on suicide prevention, Complete Psychosocial Assessment, Interpersonal group therapy.  Evaluation of Outcomes: Not Met   Progress in Treatment: Attending groups: Yes. Participating in groups: Yes. Taking medication as prescribed: Yes. Toleration medication: Yes. Family/Significant other contact made: No, will contact:  roommate Patient understands diagnosis: No. Discussing patient identified problems/goals with staff: Yes. Medical problems stabilized or resolved: Yes. Denies suicidal/homicidal ideation: Yes. Issues/concerns per patient self-inventory: No.   New problem(s) identified: No, Describe:  none  New Short Term/Long Term Goal(s): detox, medication management for mood stabilization; elimination of SI thoughts; development of comprehensive mental wellness/sobriety plan.  Patient Goals:  "Communication."  Discharge Plan or Barriers: Expected to return home and follow up with [facility] for therapy, will be referred to a psychiatrist.  Reason for Continuation of Hospitalization: Anxiety Depression Withdrawal symptoms  Estimated Length of Stay: 3-5 days  Attendees:  Patient: Alexandria [name] 07/31/2019 8:56 AM  Physician: Queen Blossom 07/31/2019 8:56 AM  Nursing: Thomos Lemons 07/31/2019 8:56 AM  RN Care Manager: 07/31/2019 8:56 AM  Social Worker: Stephanie Acre, [place] 07/31/2019 8:56 AM  Recreational Therapist:  07/31/2019 8:56 AM  Other: Harriett Sine, NP  07/31/2019 8:56 AM  Other:  07/31/2019 8:56 AM  Other: 07/31/2019 8:56 AM   Scribe for Treatment  Team: Joellen Jersey, [place] 07/31/2019 9:53 AM

## 2019-07-31 NOTE — Progress Notes (Signed)
Recreation Therapy Notes  Date:  9.28.20 Time: 0930 Location: [address] Hall Dayroom  Group Topic: Stress Management  Goal Area(s) Addresses:  Patient will identify positive stress management techniques. Patient will identify benefits of using stress management post d/c.  Behavioral Response:  Engaged  Intervention:  Stress Management  Activity :  Meditation.  LRT introduced the stress management technique of meditation.  LRT played a meditation that focused on making the most of your day.  Patients were to listen as the meditation played to fully engage in the activity.  Education:  Stress Management, Discharge Planning.   Education Outcome: Acknowledges Education  Clinical Observations/Feedback:  Pt attended and engaged in the activity.     Sharif Rendell, LRT/CTRS         Shiva Karis A 07/31/2019 10:43 AM 

## 2019-07-31 NOTE — [facility] Suicide Risk Assessment (Signed)
[place] INPATIENT:  Family/Significant Other Suicide Prevention Education  Suicide Prevention Education:  Contact Attempts: roommate, Harvin Hazel [phone]) has been identified by the patient as the family member/significant other with whom the patient will be residing, and identified as the person(s) who will aid the patient in the event of a mental health crisis.  With written consent from the patient, two attempts were made to provide suicide prevention education, prior to and/or following the patient's discharge.  We were unsuccessful in providing suicide prevention education.  A suicide education pamphlet was given to the patient to share with family/significant other.  Date and time of first attempt: 07/31/2019 at 2:45pm. No option to leave voicemail. Date and time of second attempt: to be attempted at a later time  Joellen Jersey 07/31/2019, 2:49 PM

## 2019-07-31 NOTE — Plan of Care (Signed)
  Problem: Education: Goal: Verbalization of understanding the information provided will improve Outcome: Progressing   Problem: Activity: Goal: Interest or engagement in activities will improve Outcome: Progressing   Problem: Safety: Goal: Periods of time without injury will increase Outcome: Progressing

## 2019-07-31 NOTE — Progress Notes (Signed)
Patient ID: Alexandria [name], female   DOB: May 17, 1996, 23 y.o.   MRN: [id number]    NOVEL CORONAVIRUS (COVID-19) DAILY CHECK-OFF SYMPTOMS - answer yes or no to each - every day NO YES  Have you had a fever in the past 24 hours?  . Fever (Temp > 37.80C / 100F) X   Have you had any of these symptoms in the past 24 hours? . New Cough .  Sore Throat  .  Shortness of Breath .  Difficulty Breathing .  Unexplained Body Aches   X   Have you had any one of these symptoms in the past 24 hours not related to allergies?   . Runny Nose .  Nasal Congestion .  Sneezing   X   If you have had runny nose, nasal congestion, sneezing in the past 24 hours, has it worsened?  X   EXPOSURES - check yes or no X   Have you traveled outside the state in the past 14 days?  X   Have you been in contact with someone with a confirmed diagnosis of COVID-19 or PUI in the past 14 days without wearing appropriate PPE?  X   Have you been living in the same home as a person with confirmed diagnosis of COVID-19 or a PUI (household contact)?    X   Have you been diagnosed with COVID-19?    X              What to do next: Answered NO to all: Answered YES to anything:   Proceed with unit schedule Follow the [facility] Inpatient Flowsheet.

## 2019-07-31 NOTE — Plan of Care (Signed)
Patient stayed in the milieu. Was met in the dayroom where she was sitting, talking to peers. Alert and oriented and pleasant upon approach. Attended group and had a snack. Became anxious around bedtime and received Vistaril. Denying thoughts of self harm. Denying hallucinations. No major distress. Currently in room, preparing to go go to sleep. Safety monitored per unit protocol.

## 2019-07-31 NOTE — Progress Notes (Signed)
[facility] MD Progress Note  07/31/2019 10:06 AM Alexandria [name]  MRN:  [id number] Subjective: Patient is a 23 year old female with a reported past psychiatric history significant for posttraumatic stress disorder, generalized anxiety disorder, self-reported history of attention deficit disorder who presented to the [facility] emergency department on 07/29/2019 after an intentional overdose of Xanax.  Objective: Patient is seen and examined.  Patient is a 23 year old female with the above-stated past psychiatric history who is seen in follow-up.  I reviewed her electronic medical record, and she considers having trauma history since between ages 53 and 60.  She did complain of some degree of nightmares.  She did state that there are triggers that will make her remember some of the trauma that she witnessed.  She had met with her family practice doctor in November 2019, and medications and therapy were recommended at that time.  The patient declined medication because she did not want to take medicines.  She stated that she had not abused Xanax in the past, and that she had gotten the Xanax from a friend that she overdosed on.  She also stated that she feels as though she has attention deficit disorder.  She would like to have this evaluated.  She stated that she would like to get started on stimulants.  I told her that I would be concerned that her anxiety would worsen with stimulants, but she stated that she had "gotten some" Adderall from someone and took it and it was very helpful to her.  We discussed contacting her outpatient therapist/psychiatrist to do neuropsych testing to validate the diagnosis.  We discussed the potential of either trazodone or prazosin for her nightmares, but she prefers not to take any additional medicine.  She denied suicidal ideation today.  She stated that she is very anxious about returning home because her roommates are mad at her for overdosing.  Her vital signs are  stable, she is afebrile.  Her CIWA this morning was 6.  She slept 6.25 hours last night.  Review of her laboratories revealed essentially normal electrolytes, a mild increase in hematocrit and hemoglobin at 15.8 and 47.8.  Drug screen was positive for benzodiazepines only.  She denied suicidal ideation this morning and would like to be discharged today or tomorrow if at all possible.  Principal Problem: <principal problem not specified> Diagnosis: Active Problems:   Severe recurrent major depression without psychotic features ([place])  Total Time spent with patient: 30 minutes  Past Psychiatric History: See admission H&P  Past Medical History:  Past Medical History:  Diagnosis Date  . ADHD   . Depression   . PTSD (post-traumatic stress disorder)    History reviewed. No pertinent surgical history. Family History: History reviewed. No pertinent family history. Family Psychiatric  History: See admission H&P Social History:  Social History   Substance and Sexual Activity  Alcohol Use Yes  . Frequency: Never     Social History   Substance and Sexual Activity  Drug Use Never    Social History   Socioeconomic History  . Marital status: Single    Spouse name: Not on file  . Number of children: Not on file  . Years of education: Not on file  . Highest education level: Not on file  Occupational History  . Not on file  Social Needs  . Financial resource strain: Not on file  . Food insecurity    Worry: Not on file    Inability: Not on file  .  Transportation needs    Medical: Not on file    Non-medical: Not on file  Tobacco Use  . Smoking status: Never Smoker  . Smokeless tobacco: Never Used  Substance and Sexual Activity  . Alcohol use: Yes    Frequency: Never  . Drug use: Never  . Sexual activity: Yes    Birth control/protection: I.U.D., Condom  Lifestyle  . Physical activity    Days per week: Not on file    Minutes per session: Not on file  . Stress: Not on file   Relationships  . Social Herbalist on phone: Not on file    Gets together: Not on file    Attends religious service: Not on file    Active member of club or organization: Not on file    Attends meetings of clubs or organizations: Not on file    Relationship status: Not on file  Other Topics Concern  . Not on file  Social History Narrative  . Not on file   Additional Social History:                         Sleep: Good  Appetite:  Fair  Current Medications: Current Facility-Administered Medications  Medication Dose Route Frequency Provider Last Rate Last Dose  . acetaminophen (TYLENOL) tablet 650 mg  650 mg Oral Q6H PRN Lindon Romp A, NP      . alum & mag hydroxide-simeth (MAALOX/MYLANTA) 200-200-20 MG/5ML suspension 30 mL  30 mL Oral Q4H PRN Lindon Romp A, NP      . feeding supplement (ENSURE ENLIVE) (ENSURE ENLIVE) liquid 237 mL  237 mL Oral BID BM Cobos, Myer Peer, MD   237 mL at 07/30/19 1315  . hydrOXYzine (ATARAX/VISTARIL) tablet 10 mg  10 mg Oral TID PRN Cobos, Myer Peer, MD   10 mg at 07/31/19 1004  . LORazepam (ATIVAN) tablet 1 mg  1 mg Oral Q6H PRN Lindon Romp A, NP   1 mg at 07/30/19 2246  . magnesium hydroxide (MILK OF MAGNESIA) suspension 30 mL  30 mL Oral Daily PRN Lindon Romp A, NP      . sertraline (ZOLOFT) tablet 50 mg  50 mg Oral Daily Cobos, Myer Peer, MD   50 mg at 07/31/19 0743  . traZODone (DESYREL) tablet 50 mg  50 mg Oral QHS PRN Rozetta Nunnery, NP        Lab Results:  Results for orders placed or performed during the hospital encounter of 07/29/19 (from the past 48 hour(s))  Rapid urine drug screen (hospital performed)     Status: Abnormal   Collection Time: 07/29/19  8:55 PM  Result Value Ref Range   Opiates NONE DETECTED NONE DETECTED   Cocaine NONE DETECTED NONE DETECTED   Benzodiazepines POSITIVE (A) NONE DETECTED   Amphetamines NONE DETECTED NONE DETECTED   Tetrahydrocannabinol NONE DETECTED NONE DETECTED    Barbiturates NONE DETECTED NONE DETECTED    Comment: (NOTE) DRUG SCREEN FOR MEDICAL PURPOSES ONLY.  IF CONFIRMATION IS NEEDED FOR ANY PURPOSE, NOTIFY LAB WITHIN 5 DAYS. LOWEST DETECTABLE LIMITS FOR URINE DRUG SCREEN Drug Class                     Cutoff (ng/mL) Amphetamine and metabolites    1000 Barbiturate and metabolites    200 Benzodiazepine                 502 Tricyclics and metabolites  300 Opiates and metabolites        300 Cocaine and metabolites        300 THC                            50 Performed at [facility] [address] [place], [place] [address]   Comprehensive metabolic panel     Status: Abnormal   Collection Time: 07/29/19  9:09 PM  Result Value Ref Range   Sodium 141 135 - 145 mmol/L   Potassium 4.1 3.5 - 5.1 mmol/L   Chloride 106 98 - 111 mmol/L   CO2 23 22 - 32 mmol/L   Glucose, Bld 92 70 - 99 mg/dL   BUN 10 6 - 20 mg/dL   Creatinine, Ser 0.66 0.44 - 1.00 mg/dL   Calcium 9.6 8.9 - 10.3 mg/dL   Total Protein 8.7 (H) 6.5 - 8.1 g/dL   Albumin 5.3 (H) 3.5 - 5.0 g/dL   AST 18 15 - 41 U/L   ALT 13 0 - 44 U/L   Alkaline Phosphatase 64 38 - 126 U/L   Total Bilirubin 0.6 0.3 - 1.2 mg/dL   GFR calc non Af Amer >60 >60 mL/min   GFR calc Af Amer >60 >60 mL/min   Anion gap 12 5 - 15    Comment: Performed at [facility], [place] [address], [place], [place] [address]  Ethanol     Status: None   Collection Time: 07/29/19  9:09 PM  Result Value Ref Range   Alcohol, Ethyl (B) <10 <10 mg/dL    Comment: (NOTE) Lowest detectable limit for serum alcohol is 10 mg/dL. For medical purposes only. Performed at [facility], [place] [address], [place], [place] [address]   Salicylate level     Status: None   Collection Time: 07/29/19  9:09 PM  Result Value Ref Range   Salicylate Lvl <2.2 2.8 - 30.0 mg/dL    Comment: Performed at [facility], [place] [address] [place], [place] [address]   Acetaminophen level     Status: Abnormal   Collection Time: 07/29/19  9:09 PM  Result Value Ref Range   Acetaminophen (Tylenol), Serum <10 (L) 10 - 30 ug/mL    Comment: (NOTE) Therapeutic concentrations vary significantly. A range of 10-30 ug/mL  may be an effective concentration for many patients. However, some  are best treated at concentrations outside of this range. Acetaminophen concentrations >150 ug/mL at 4 hours after ingestion  and >50 ug/mL at 12 hours after ingestion are often associated with  toxic reactions. Performed at [facility], [place] [address] [place], [place] [address]   cbc     Status: Abnormal   Collection Time: 07/29/19  9:09 PM  Result Value Ref Range   WBC 9.1 4.0 - 10.5 K/uL   RBC 5.03 3.87 - 5.11 MIL/uL   Hemoglobin 15.8 (H) 12.0 - 15.0 g/dL   HCT 47.8 (H) 36.0 - 46.0 %   MCV 95.0 80.0 - 100.0 fL   MCH 31.4 26.0 - 34.0 pg   MCHC 33.1 30.0 - 36.0 g/dL   RDW 11.9 11.5 - 15.5 %   Platelets 249 150 - 400 K/uL   nRBC 0.0 0.0 - 0.2 %    Comment: Performed at [facility], [place] [address], [place], [place] [address]  I-Stat beta hCG blood, ED     Status:  None   Collection Time: 07/29/19  9:13 PM  Result Value Ref Range   I-stat hCG, quantitative <5.0 <5 mIU/mL   Comment 3            Comment:   GEST. AGE      CONC.  (mIU/mL)   <=1 WEEK        5 - 50     2 WEEKS       50 - 500     3 WEEKS       100 - 10,000     4 WEEKS     1,000 - 30,000        FEMALE AND NON-PREGNANT FEMALE:     LESS THAN 5 mIU/mL   SARS Coronavirus 2 [facility] order, Performed in [facility] hospital lab) Nasopharyngeal Nasopharyngeal Swab     Status: None   Collection Time: 07/30/19  1:24 AM   Specimen: Nasopharyngeal Swab  Result Value Ref Range   SARS Coronavirus 2 NEGATIVE NEGATIVE    Comment: (NOTE) If result is NEGATIVE SARS-CoV-2 target nucleic acids are NOT DETECTED. The SARS-CoV-2 RNA is generally detectable in upper and lower   respiratory specimens during the acute phase of infection. The lowest  concentration of SARS-CoV-2 viral copies this assay can detect is 250  copies / mL. A negative result does not preclude SARS-CoV-2 infection  and should not be used as the sole basis for treatment or other  patient management decisions.  A negative result may occur with  improper specimen collection / handling, submission of specimen other  than nasopharyngeal swab, presence of viral mutation(s) within the  areas targeted by this assay, and inadequate number of viral copies  (<250 copies / mL). A negative result must be combined with clinical  observations, patient history, and epidemiological information. If result is POSITIVE SARS-CoV-2 target nucleic acids are DETECTED. The SARS-CoV-2 RNA is generally detectable in upper and lower  respiratory specimens dur ing the acute phase of infection.  Positive  results are indicative of active infection with SARS-CoV-2.  Clinical  correlation with patient history and other diagnostic information is  necessary to determine patient infection status.  Positive results do  not rule out bacterial infection or co-infection with other viruses. If result is PRESUMPTIVE POSTIVE SARS-CoV-2 nucleic acids MAY BE PRESENT.   A presumptive positive result was obtained on the submitted specimen  and confirmed on repeat testing.  While 2019 novel coronavirus  (SARS-CoV-2) nucleic acids may be present in the submitted sample  additional confirmatory testing may be necessary for epidemiological  and / or clinical management purposes  to differentiate between  SARS-CoV-2 and other Sarbecovirus currently known to infect humans.  If clinically indicated additional testing with an alternate test  methodology [phone]) is advised. The SARS-CoV-2 RNA is generally  detectable in upper and lower respiratory sp ecimens during the acute  phase of infection. The expected result is Negative. Fact  Sheet for Patients:  StrictlyIdeas.no Fact Sheet for Healthcare Providers: BankingDealers.co.za This test is not yet approved or cleared by the [place] FDA and has been authorized for detection and/or diagnosis of SARS-CoV-2 by FDA under an Emergency Use Authorization (EUA).  This EUA will remain in effect (meaning this test can be used) for the duration of the COVID-19 declaration under Section 564(b)(1) of the Act, 21 U.S.C. section 360bbb-3(b)(1), unless the authorization is terminated or revoked sooner. Performed at [facility], [place] [address], [place], [place] [address]  Blood Alcohol level:  Lab Results  Component Value Date   ETH <10 80/16/5537    Metabolic Disorder Labs: No results found for: HGBA1C, MPG No results found for: PROLACTIN No results found for: CHOL, TRIG, HDL, CHOLHDL, VLDL, LDLCALC  Physical Findings: AIMS: Facial and Oral Movements Muscles of Facial Expression: None, normal Lips and Perioral Area: None, normal Jaw: None, normal Tongue: None, normal,Extremity Movements Upper (arms, wrists, hands, fingers): None, normal Lower (legs, knees, ankles, toes): None, normal, Trunk Movements Neck, shoulders, hips: None, normal, Overall Severity Severity of abnormal movements (highest score from questions above): None, normal Incapacitation due to abnormal movements: None, normal Patient's awareness of abnormal movements (rate only patient's report): No Awareness, Dental Status Current problems with teeth and/or dentures?: No Does patient usually wear dentures?: No  CIWA:  CIWA-Ar Total: 6 COWS:  COWS Total Score: 2  Musculoskeletal: Strength & Muscle Tone: within normal limits Gait & Station: normal Patient leans: N/A  Psychiatric Specialty Exam: Physical Exam  Nursing note and vitals reviewed. Constitutional: She is oriented to person, place, and time. She appears  well-developed and well-nourished.  HENT:  Head: Normocephalic and atraumatic.  Respiratory: Effort normal.  Neurological: She is alert and oriented to person, place, and time.    ROS  Blood pressure 108/77, pulse (!) 105, temperature 98.4 F (36.9 C), temperature source Oral, resp. rate 16, height 5' 3"  (1.6 m), weight 49.9 kg, last menstrual period 07/24/2019.Body mass index is 19.49 kg/m.  General Appearance: Casual  Eye Contact:  Good  Speech:  Normal Rate  Volume:  Normal  Mood:  Anxious  Affect:  Congruent  Thought Process:  Coherent and Descriptions of Associations: Intact  Orientation:  Full (Time, Place, and Person)  Thought Content:  Logical  Suicidal Thoughts:  No  Homicidal Thoughts:  No  Memory:  Immediate;   Fair Recent;   Fair Remote;   Fair  Judgement:  Intact  Insight:  Fair  Psychomotor Activity:  Normal  Concentration:  Concentration: Fair and Attention Span: Fair  Recall:  [employer] of Knowledge:  Fair  Language:  Fair  Akathisia:  Negative  Handed:  Right  AIMS (if indicated):     Assets:  Desire for Improvement Resilience  ADL's:  Intact  Cognition:  WNL  Sleep:  Number of Hours: 6.25     Treatment Plan Summary: Daily contact with patient to assess and evaluate symptoms and progress in treatment, Medication management and Plan : Patient is seen and examined.  Patient is a 23 year old female with the above-stated past psychiatric history who is seen in follow-up.   Diagnosis: #1 posttraumatic stress disorder, #2 major depression, recurrent, severe without psychotic features, #3 generalized anxiety disorder  Patient is seen in follow-up.  She is doing fine and only having mild side effects to the Zoloft so far.  She prefers not to take any additional medicines at this time.  No change in her current medications and if all continues to go well we will plan on discharge tomorrow. 1.  Continue hydroxyzine 10 mg p.o. 3 times daily as needed  anxiety. 2.  Continue lorazepam 1 mg p.o. every 6 hours PRN a CIWA greater than 10. 3.  Continue Zoloft 50 mg p.o. daily for anxiety and depression. 4.  Continue trazodone 50 mg p.o. nightly as needed insomnia. 5.  Disposition planning-in progress.  Sharma Covert, MD 07/31/2019, 10:06 AM

## 2019-07-31 NOTE — Progress Notes (Signed)
The patient rated her day as a 6 out of a possible 10. She shared with the group that she experienced an anxiety attack, took medication, and now feels strange. She went on to say that she feels that her muscles feel weak. Her goal for tomorrow is to talk to her roommates by phone.

## 2019-07-31 NOTE — Progress Notes (Signed)
NUTRITION ASSESSMENT RD working remotely.   Pt identified as at risk on the Malnutrition Screen Tool  INTERVENTION: - continue Ensure Enlive BID, each supplement provides 350 kcal and 20 grams of protein. - continue to encourage PO intakes.    NUTRITION DIAGNOSIS: Unintentional weight loss related to sub-optimal intake as evidenced by pt report.   Goal: Pt to meet >/= 90% of their estimated nutrition needs.  Monitor:  PO intake  Assessment:  Patient was a voluntary admission. She reported overdosing on Xanax on 9/25 PM in a suicide attempt. Her friend took her to the ED after noticing that patient was slurring her words. She reported feeling more depressed than usual over the past few weeks.  Per chart review, current weight is 110 lb and weight on 12/28/18 was 119 lb. This indicates 9 lb weight loss (7.5% body weight) in the past 7 months; not significant for time frame.  Ensure Enlive ordered BID per [facility]NS protocol and patient accepted 1 of the 2 bottles offered to her.    23 y.o. female  Height: Ht Readings from Last 1 Encounters:  07/30/19 5\' 3"  (1.6 m)    Weight: Wt Readings from Last 1 Encounters:  07/30/19 49.9 kg    Weight Hx: Wt Readings from Last 10 Encounters:  07/30/19 49.9 kg  12/29/18 54 kg  12/28/18 54 kg    BMI:  Body mass index is 19.49 kg/m. Pt meets criteria for normal weight based on current BMI.  Estimated Nutritional Needs: Kcal: 25-30 kcal/kg Protein: > 1 gram protein/kg Fluid: 1 ml/kcal  Diet Order:  Diet Order            Diet regular Room service appropriate? Yes; Fluid consistency: Thin  Diet effective now             Pt is also offered choice of unit snacks mid-morning and mid-afternoon.  Pt is eating as desired.   Lab results and medications reviewed.     Jarome Matin, MS, RD, LDN, [facility] Inpatient Clinical Dietitian Pager # [phone] After hours/weekend pager # [phone]

## 2019-08-01 LAB — HEMOGLOBIN A1C
Hgb A1c MFr Bld: 4.8 % (ref 4.8–5.6)
Mean Plasma Glucose: 91.06 mg/dL

## 2019-08-01 LAB — TSH: TSH: 2.224 u[IU]/mL (ref 0.350–4.500)

## 2019-08-01 LAB — LIPID PANEL
Cholesterol: 91 mg/dL (ref 0–200)
HDL: 39 mg/dL — ABNORMAL LOW (ref >40)
LDL Cholesterol: 44 mg/dL (ref 0–99)
Total CHOL/HDL Ratio: 2.3 RATIO
Triglycerides: 41 mg/dL (ref <150)
VLDL: 8 mg/dL (ref 0–40)

## 2019-08-01 MED ORDER — SERTRALINE HCL 25 MG PO TABS
75.0000 mg | ORAL_TABLET | Freq: Every day | ORAL | Status: DC
  Administered 2019-08-02: 08:00:00 75 mg via ORAL
  Filled 2019-08-01 (×3): qty 3

## 2019-08-01 NOTE — [facility] Group Notes (Addendum)
LCSW Group Therapy Note 08/01/2019 2:58 PM  Type of Therapy and Topic: Group Therapy: Overcoming Obstacles  Participation Level: Active  Description of Group:  In this group patients will be encouraged to explore what they see as obstacles to their own wellness and recovery. They will be guided to discuss their thoughts, feelings, and behaviors related to these obstacles. The group will process together ways to cope with barriers, with attention given to specific choices patients can make. Each patient will be challenged to identify changes they are motivated to make in order to overcome their obstacles. This group will be process-oriented, with patients participating in exploration of their own experiences as well as giving and receiving support and challenge from other group members.  Therapeutic Goals: 1. Patient will identify personal and current obstacles as they relate to admission. 2. Patient will identify barriers that currently interfere with their wellness or overcoming obstacles.  3. Patient will identify feelings, thought process and behaviors related to these barriers. 4. Patient will identify two changes they are willing to make to overcome these obstacles:   Summary of Patient Progress  Patient expressed that her name is Alexandria [name].   Alexandria [name] was engaged and participated throughout the group session. Alexandria [name] reports that her main obstacle is "trying to build a plan while in here". She states that she has had the opportunity to reflect and build a plan, however she cannot complete any tasks while being the hospital. Alexandria [name] states that she is concerned her roommate will kick her out at discharge, because he has not answered any of her phone calls while she has been in the hospital.     Therapeutic Modalities:  Cognitive Behavioral Therapy Solution Focused Therapy Motivational Interviewing Relapse Prevention Therapy   Crump Worker

## 2019-08-01 NOTE — Progress Notes (Addendum)
[facility] MD Progress Note  08/01/2019 11:48 AM Alexandria [name]  MRN:  [id number] Subjective: Patient describes partial improvement compared to admission.  States that today she is feeling somewhat more anxious after having had a "panic attack" last night , after which it was difficult for her to sleep.  She reports feeling subjectively sedated this morning following PRN medication she was given for anxiety , but presents attentive and able to participate in session without difficulty.  Denies suicidal ideations.    Objective: I have reviewed chart notes, reviewed case with treatment team and have met with patient.  23 year old female.  History of depression, characterized as chronic but recently worsening.  Also endorses history of PTSD symptoms.  Presented following benzodiazepine (Xanax) overdose and suicide attempt.  States overdose was impulsive/unplanned and followed getting a negative/critical text message from a friend.  She does endorse neurovegetative symptoms of depression but denies having had any suicidal ideations prior to above event.  Reports she had obtained Xanax from a friend to manage anxiety but denies  pattern of benzodiazepine abuse and states had only taken twice.  Today patient presents alert, attentive although as above endorses feeling subjectively sedated.  Denies suicidal ideations and presents future oriented.  Endorses ruminations and anxiety regarding returning to her home, as she states she has had strained relationship with 1 of her roommates and her main worry is that "they might ask me to move out". As above, although endorses overall improvement in mood and anxiety symptoms compared to admission, states she had a panic attack late last night, after which it was hard for her to fall asleep.  She does not identify any specific triggers, except for ruminations regarding her relationship with roommates. Denies medication side effects-on Zoloft which she is tolerating well thus  far. Visible on unit, going to groups.  Behavior on unit in good control. Labs reviewed-serum glucose 91 , lipid panel unremarkable, TSH 2.22, hemoglobin A1c 4.8   Principal Problem: MDD Diagnosis: Active Problems:   Severe recurrent major depression without psychotic features ([place])  Total Time spent with patient: 15 minutes  Past Psychiatric History: See admission H&P  Past Medical History:  Past Medical History:  Diagnosis Date  . ADHD   . Depression   . PTSD (post-traumatic stress disorder)    History reviewed. No pertinent surgical history. Family History: History reviewed. No pertinent family history. Family Psychiatric  History: See admission H&P Social History:  Social History   Substance and Sexual Activity  Alcohol Use Yes  . Frequency: Never     Social History   Substance and Sexual Activity  Drug Use Never    Social History   Socioeconomic History  . Marital status: Single    Spouse name: Not on file  . Number of children: Not on file  . Years of education: Not on file  . Highest education level: Not on file  Occupational History  . Not on file  Social Needs  . Financial resource strain: Not on file  . Food insecurity    Worry: Not on file    Inability: Not on file  . Transportation needs    Medical: Not on file    Non-medical: Not on file  Tobacco Use  . Smoking status: Never Smoker  . Smokeless tobacco: Never Used  Substance and Sexual Activity  . Alcohol use: Yes    Frequency: Never  . Drug use: Never  . Sexual activity: Yes    Birth control/protection: I.U.D., Condom  Lifestyle  . Physical activity    Days per week: Not on file    Minutes per session: Not on file  . Stress: Not on file  Relationships  . Social Herbalist on phone: Not on file    Gets together: Not on file    Attends religious service: Not on file    Active member of club or organization: Not on file    Attends meetings of clubs or organizations: Not on  file    Relationship status: Not on file  Other Topics Concern  . Not on file  Social History Narrative  . Not on file   Additional Social History:   Sleep: Fair  Appetite:  Fair  Current Medications: Current Facility-Administered Medications  Medication Dose Route Frequency Provider Last Rate Last Dose  . acetaminophen (TYLENOL) tablet 650 mg  650 mg Oral Q6H PRN Lindon Romp A, NP      . alum & mag hydroxide-simeth (MAALOX/MYLANTA) 200-200-20 MG/5ML suspension 30 mL  30 mL Oral Q4H PRN Lindon Romp A, NP      . feeding supplement (ENSURE ENLIVE) (ENSURE ENLIVE) liquid 237 mL  237 mL Oral BID BM Kally Cadden, Myer Peer, MD   237 mL at 07/30/19 1315  . hydrOXYzine (ATARAX/VISTARIL) tablet 10 mg  10 mg Oral TID PRN Latoria Dry, Myer Peer, MD   10 mg at 07/31/19 2042  . LORazepam (ATIVAN) tablet 1 mg  1 mg Oral Q6H PRN Lindon Romp A, NP   1 mg at 08/01/19 0250  . magnesium hydroxide (MILK OF MAGNESIA) suspension 30 mL  30 mL Oral Daily PRN Lindon Romp A, NP      . sertraline (ZOLOFT) tablet 50 mg  50 mg Oral Daily [place], Myer Peer, MD   50 mg at 08/01/19 0846  . traZODone (DESYREL) tablet 50 mg  50 mg Oral QHS PRN Rozetta Nunnery, NP        Lab Results:  Results for orders placed or performed during the hospital encounter of 07/30/19 (from the past 48 hour(s))  Hemoglobin A1c     Status: None   Collection Time: 08/01/19  6:51 AM  Result Value Ref Range   Hgb A1c MFr Bld 4.8 4.8 - 5.6 %    Comment: (NOTE) Pre diabetes:          5.7%-6.4% Diabetes:              >6.4% Glycemic control for   <7.0% adults with diabetes    Mean Plasma Glucose 91.06 mg/dL    Comment: Performed at [facility] [address] [place], [place] [address]  Lipid panel     Status: Abnormal   Collection Time: 08/01/19  6:51 AM  Result Value Ref Range   Cholesterol 91 0 - 200 mg/dL   Triglycerides 41 <150 mg/dL   HDL 39 (L) >40 mg/dL   Total CHOL/HDL Ratio 2.3 RATIO   VLDL 8 0 - 40 mg/dL   LDL  Cholesterol 44 0 - 99 mg/dL    Comment:        Total Cholesterol/HDL:CHD Risk Coronary Heart Disease Risk Table                     Men   Women  1/2 Average Risk   3.4   3.3  Average Risk       5.0   4.4  2 X Average Risk   9.6   7.1  3 X Average Risk  23.4   11.0        Use the calculated Patient Ratio above and the CHD Risk Table to determine the patient's CHD Risk.        ATP III CLASSIFICATION (LDL):  <100     mg/dL   Optimal  100-129  mg/dL   Near or Above                    Optimal  130-159  mg/dL   Borderline  160-189  mg/dL   High  >190     mg/dL   Very High Performed at [facility] [address], [place], [place] [address]   TSH     Status: None   Collection Time: 08/01/19  6:51 AM  Result Value Ref Range   TSH 2.224 0.350 - 4.500 uIU/mL    Comment: Performed by a 3rd Generation assay with a functional sensitivity of <=0.01 uIU/mL. Performed at [facility], [place] [address] [place], [place] [address]     Blood Alcohol level:  Lab Results  Component Value Date   ETH <10 77/41/2878    Metabolic Disorder Labs: Lab Results  Component Value Date   HGBA1C 4.8 08/01/2019   MPG 91.06 08/01/2019   No results found for: PROLACTIN Lab Results  Component Value Date   CHOL 91 08/01/2019   TRIG 41 08/01/2019   HDL 39 (L) 08/01/2019   CHOLHDL 2.3 08/01/2019   VLDL 8 08/01/2019   LDLCALC 44 08/01/2019    Physical Findings: AIMS: Facial and Oral Movements Muscles of Facial Expression: None, normal Lips and Perioral Area: None, normal Jaw: None, normal Tongue: None, normal,Extremity Movements Upper (arms, wrists, hands, fingers): None, normal Lower (legs, knees, ankles, toes): None, normal, Trunk Movements Neck, shoulders, hips: None, normal, Overall Severity Severity of abnormal movements (highest score from questions above): None, normal Incapacitation due to abnormal movements: None, normal Patient's awareness of  abnormal movements (rate only patient's report): No Awareness, Dental Status Current problems with teeth and/or dentures?: No Does patient usually wear dentures?: No  CIWA:  CIWA-Ar Total: 5 COWS:  COWS Total Score: 2  Musculoskeletal: Strength & Muscle Tone: within normal limits Gait & Station: normal Patient leans: N/A  Psychiatric Specialty Exam: Physical Exam  Nursing note and vitals reviewed. Constitutional: She is oriented to person, place, and time. She appears well-developed and well-nourished.  HENT:  Head: Normocephalic and atraumatic.  Respiratory: Effort normal.  Neurological: She is alert and oriented to person, place, and time.    ROS denies chest pain or shortness of breath, no vomiting, no fever, no chills  Blood pressure (!) 92/57, pulse (!) 128, temperature 98.4 F (36.9 C), temperature source Oral, resp. rate 16, height 5' 3"  (1.6 m), weight 49.9 kg, last menstrual period 07/24/2019.Body mass index is 19.49 kg/m.  Repeat Vitals 100/71, pulse 89  General Appearance: Casual  Eye Contact:  Good  Speech:  Normal Rate  Volume:  Normal  Mood:  Describes partial improvement but reports still feeling depressed and anxious  Affect:  Vaguely constricted, does smile at times appropriately during session  Thought Process:  Linear and Descriptions of Associations: Intact  Orientation:  Other:  Fully alert and attentive  Thought Content:  No hallucinations, no delusions  Suicidal Thoughts:  No denies suicidal or self-injurious ideations, contracts for safety on unit, denies homicidal or violent ideations  Homicidal Thoughts:  No  Memory:  Recent and remote  grossly intact  Judgement:  Other:  Improving  Insight:  Improving improving  Psychomotor Activity:  Normal and No current psychomotor agitation or restlessness noted  Concentration:  Concentration: Good and Attention Span: Good  Recall:  Good  Fund of Knowledge:  Good  Language:  Good  Akathisia:  Negative   Handed:  Right  AIMS (if indicated):     Assets:  Desire for Improvement Resilience  ADL's:  Intact  Cognition:  WNL  Sleep:  Number of Hours: 5.25   Assessment:  23 year old female.  History of depression, characterized as chronic but recently worsening.  Also endorses history of PTSD symptoms.  Presented following benzodiazepine (Xanax) overdose and suicide attempt.  States overdose was impulsive/unplanned and followed getting a negative/critical text message from a friend.  She does endorse neurovegetative symptoms of depression but denies having had any suicidal ideations prior to above event.  Reports she had obtained Xanax from a friend to manage anxiety but denies  pattern of benzodiazepine abuse and states had only taken twice.  Patient reports overall improvement compared to admission.  Endorses partially improved mood.  Denies suicidal ideations and presents future oriented.  Endorses residual/persistent anxiety and describes having had a panic attack last night.  Currently calm, in no acute distress.  Tolerating Zoloft trial well thus far.  Reports some subjective sedation from PRN medications (Ativan, hydroxyzine), but is currently presenting alert, attentive.   Treatment Plan Summary: Daily contact with patient to assess and evaluate symptoms and progress in treatment, Medication management and Plan : Patient is seen and examined.  Patient is a 23 year old female with the above-stated past psychiatric history who is seen in follow-up.  Treatment plan reviewed as below today 9/29 Encourage group and milieu participation Treatment team working on disposition planning options 1.  Continue hydroxyzine 10 mg p.o. 3 times daily as needed anxiety. 2.  Continue lorazepam 1 mg p.o. every 6 hours PRN a CIWA greater than 10. 3.  Increased Zoloft to 75 mg p.o. daily for anxiety and depression. 4.  Continue trazodone 50 mg p.o. nightly as needed insomnia.   Jenne Campus, MD 08/01/2019,  11:48 AM   Patient ID: Higinio Plan, female   DOB: 1996-02-08, 23 y.o.   MRN: [id number]

## 2019-08-01 NOTE — [facility] Group Notes (Signed)
Adult Psychoeducational Group Note  Date:  08/01/2019 Time:  9:35 AM  Group Topic/Focus:  Goals Group:   The focus of this group is to help patients establish daily goals to achieve during treatment and discuss how the patient can incorporate goal setting into their daily lives to aide in recovery.  Participation Level:  Minimal  Participation Quality:  Appropriate  Affect:  Appropriate  Cognitive:  Appropriate  Insight: Appropriate  Engagement in Group:  Engaged  Modes of Intervention:  Discussion  Additional Comments:  Patient attended and participated in the goals group in which she shared that her goal for the day was to be less anxious about things she can't change and she plan to do it by thinking about things she can do when she gets discharged.  Annie Sable 08/01/2019, 9:35 AM

## 2019-08-01 NOTE — Progress Notes (Signed)
Patient went to bed and slept a few hours then woke up in the middle of the night with increased anxiety and restlessness. Patient became more and more agitated crying and saying "I've got to get this anxiety taken care of...".  Patient  Received Ativan, 1 mg as prescribed. Patient went back to bed and slept until morning.

## 2019-08-01 NOTE — Progress Notes (Signed)
Spiritual care group on grief and loss facilitated by chaplain Milfred Krammes  Group Goal:  Support / Education around grief and loss Members engage in facilitated group support and psycho-social education.  Group Description:  Following introductions and group rules, group members engaged in facilitated group dialog and support around topic of loss, with particular support around experiences of loss in their lives. Group Identified types of loss (relationships / self / things) and identified patterns, circumstances, and changes that precipitate losses. Reflected on thoughts / feelings around loss, normalized grief responses, and recognized variety in grief experience. Patient Progress:  

## 2019-08-01 NOTE — [facility] Suicide Risk Assessment (Signed)
[facility] INPATIENT:  Family/Significant Other Suicide Prevention Education  Suicide Prevention Education:  Contact Attempts: with roommate, Harvin Hazel [phone]) has been identified by the patient as the family member/significant other with whom the patient will be residing, and identified as the person(s) who will aid the patient in the event of a mental health crisis.  With written consent from the patient, two attempts were made to provide suicide prevention education, prior to and/or following the patient's discharge.  We were unsuccessful in providing suicide prevention education.  A suicide education pamphlet was given to the patient to share with family/significant other.  Date and time of first attempt: 07/31/2019 / 2:45pm   Date and time of second attempt: 08/01/2019 / 3:27pm (HIPAA compliant voicemail left)   Marylee Floras 08/01/2019, 3:26 PM

## 2019-08-01 NOTE — Progress Notes (Signed)
D:  Patient's self inventory sheet, patient has poor sleep, no sleep medicine.  Fair appetite, normal energy level, good concentration.  Rated depression 4, denied hopeless and anxiety #7.  Denied withdrawals, then checked diarrhea and nausea.  Denied SI.  Denied physical problems.  Denied physical pain.  Goal is decreased anxiety about things that they can't be changed.  Plans to think of things to do.  Does have discharge plans. A:  Medications administered per MD orders.  Emotional support and encouragement given patient. R:  Denied SI and HI, contracts for safety.  Denied A/V hallucinations.  Safety maintained with15 minute checks.

## 2019-08-01 NOTE — Progress Notes (Signed)
The patient's positive event for the day was that she joked around with her peers. Her goal for tomorrow is to get discharged and to have a conversation with her roommates.

## 2019-08-02 DIAGNOSIS — T424X2A Poisoning by benzodiazepines, intentional self-harm, initial encounter: Secondary | ICD-10-CM

## 2019-08-02 MED ORDER — SERTRALINE HCL 25 MG PO TABS: 75.0000 mg | ORAL_TABLET | Freq: Every day | ORAL | 0 refills | Status: DC

## 2019-08-02 MED ORDER — TRAZODONE HCL 50 MG PO TABS: 50.0000 mg | ORAL_TABLET | Freq: Every evening | ORAL | 0 refills | Status: DC | PRN

## 2019-08-02 MED ORDER — HYDROXYZINE HCL 10 MG PO TABS: 10.0000 mg | ORAL_TABLET | Freq: Three times a day (TID) | ORAL | 0 refills | Status: DC | PRN

## 2019-08-02 NOTE — Plan of Care (Signed)
Patient was visible in the milieu until bedtime. Cooperative but anxious and restless. Alert and oriented. Participated in group activities. Compliant with medication regime. Currently in bed asleep. Safety monitored.

## 2019-08-02 NOTE — Progress Notes (Signed)
D:  Patient denied SI and HI, contracts for safety.  Denied A/V hallucinations.  Denied pain. A:  Medications administered per MD orders.  Emotional support and encouragement given patient. R:  Safety maintained with 15 minute checks.  

## 2019-08-02 NOTE — Progress Notes (Signed)
Discharge Note:  Patient discharged home with family member.  Patient denied SI and HI.  Denied A/V hallucinations.  Suicide prevention information given and discussed with patient who stated she understood and had no questions.   Patient stated she received all her belongings, clothing, toiletries, misc itemss, etc.  Patient stated she appreciated all assistance received from [facility] staff.  All required discharge information given to patient at discharge.

## 2019-08-02 NOTE — Discharge Summary (Addendum)
Physician Discharge Summary Note  Patient:  Alexandria [name] is an 23 y.o., female  MRN:  [id number]  DOB:  1996/01/18  Patient phone:  [phone] (home)   Patient address:   [address] [place] [address],   Total Time spent with patient: Greater than 30 minutes  Date of Admission:  07/30/2019  Date of Discharge: 08-02-19  Reason for Admission: Intentional drug overdose on Xanax tablets.  Principal Problem: Severe recurrent major depression without psychotic features [facility])  Discharge Diagnoses: Principal Problem:   Severe recurrent major depression without psychotic features [facility])  Past Psychiatric History: Major depressive disorder, recurrent, severe  Past Medical History:  Past Medical History:  Diagnosis Date  . ADHD   . Depression   . PTSD (post-traumatic stress disorder)    History reviewed. No pertinent surgical history.  Family History: History reviewed. No pertinent family history.  Family Psychiatric  History: See H&P  Social History:  Social History   Substance and Sexual Activity  Alcohol Use Yes  . Frequency: Never     Social History   Substance and Sexual Activity  Drug Use Never    Social History   Socioeconomic History  . Marital status: Single    Spouse name: Not on file  . Number of children: Not on file  . Years of education: Not on file  . Highest education level: Not on file  Occupational History  . Not on file  Social Needs  . Financial resource strain: Not on file  . Food insecurity    Worry: Not on file    Inability: Not on file  . Transportation needs    Medical: Not on file    Non-medical: Not on file  Tobacco Use  . Smoking status: Never Smoker  . Smokeless tobacco: Never Used  Substance and Sexual Activity  . Alcohol use: Yes    Frequency: Never  . Drug use: Never  . Sexual activity: Yes    Birth control/protection: I.U.D., Condom  Lifestyle  . Physical activity    Days per week: Not on file     Minutes per session: Not on file  . Stress: Not on file  Relationships  . Social Musician on phone: Not on file    Gets together: Not on file    Attends religious service: Not on file    Active member of club or organization: Not on file    Attends meetings of clubs or organizations: Not on file    Relationship status: Not on file  Other Topics Concern  . Not on file  Social History Narrative  . Not on file   Hospital Course: (Per Md's admission evaluation): 40 y old female . Presented to the ED yesterday voluntarily with a friend. Reports she had overdosed on Xanax on the night of 07/28/19. She reports she took about 20 mgrs of Xanax . (Xanax not prescribed to her, states had been given to her by a friend for anxiety). She denies any pattern of Xanax abuse , states she had obtained Xanax last week, before which she had not taken and had taken twice before overdose.  Reports overdose was impulsive, unplanned, but  suicidal in intent, and followed getting " a nasty text message" from a friend. States after ingestion she vomited and then fell asleep. States that her friend visited and noticed she was slurring words and was sedated so she was brought to hospital. Patient reports history of depression, which she characterizes  as chronic, but reports she has been feeling more depressed over recent weeks, in part due to interpersonal tension with roommates and has also been doing poorly in her academic performance. Endorses some neuro-vegetative symptoms as below, but does not endorse significant anhedonia.  After the above admission evaluation, [name] was recommended for mood stabilization treatments based on her presenting symptoms. The medication regimen targeting those symptoms were discussed & initiated with her consent. She was medicated, stabilized & discharged on the medications as listed on the discharge medication lists below. She was enrolled & participated in the group counseling  sessions being offered & held on this unit. She learned coping skills. She presented no other significant pre-existing medical issues that required treatment & or monitoring. She tolerated her treatment regimen without any adverse effects or reactions reported.  During the course of her hospitalization, the 15-minute checks were adequate to ensure Asees's safety.  Patient did not display any dangerous violent or suicidal behavior on the unit.  She interacted with patients & staff appropriately, participated appropriately in the group sessions/therapies. Her medications were addressed & adjusted to meet her needs. Her symptoms responded well to her treatment regimen. She was recommended for outpatient follow-up care & medication management upon discharge to assure continuity of care.  At the time of discharge patient is not reporting any acute suicidal/homicidal ideations. She feels more confident about her self-care & in managing the suicidal thoughts if they ever happen again. She currently denies any new issues or concerns. Education and supportive counseling provided throughout her hospital stay & upon discharge.  Today upon her discharge evaluation with the attending psychiatrist, [name] shares that she is doing well. She denies any other specific concerns. She is sleeping well. Her appetite is good. She denies other physical complaints. She denies AH/VH. She feels that her medications have been helpful & is in agreement to continue her current treatment regimen. She was able to engage in safety planning including plan to return to [facility] or contact emergency services if she feels unable to maintain her own safety or the safety of others. Pt had no further questions, comments, or concerns. She left [facility] with all personal belongings in no apparent distress. Transportation per arrangement (room-mate).  Physical Findings: AIMS: Facial and Oral Movements Muscles of Facial Expression: None, normal Lips and  Perioral Area: None, normal Jaw: None, normal Tongue: None, normal,Extremity Movements Upper (arms, wrists, hands, fingers): None, normal Lower (legs, knees, ankles, toes): None, normal, Trunk Movements Neck, shoulders, hips: None, normal, Overall Severity Severity of abnormal movements (highest score from questions above): None, normal Incapacitation due to abnormal movements: None, normal Patient's awareness of abnormal movements (rate only patient's report): No Awareness, Dental Status Current problems with teeth and/or dentures?: No Does patient usually wear dentures?: No  CIWA:  CIWA-Ar Total: 1 COWS:  COWS Total Score: 2  Musculoskeletal: Strength & Muscle Tone: within normal limits Gait & Station: normal Patient leans: N/A  Psychiatric Specialty Exam: Physical Exam  Nursing note and vitals reviewed. Constitutional: She is oriented to person, place, and time. She appears well-developed.  Cardiovascular:  Elevated pulse rate  Respiratory: No respiratory distress. She has no wheezes.  Genitourinary:    Genitourinary Comments: Deferred   Musculoskeletal: Normal range of motion.  Neurological: She is alert and oriented to person, place, and time.  Skin: Skin is warm.    Review of Systems  Constitutional: Negative for chills and fever.  Respiratory: Negative for cough, shortness of breath and wheezing.  Cardiovascular: Negative for chest pain and palpitations.  Gastrointestinal: Negative for heartburn, nausea and vomiting.  Neurological: Negative for dizziness and headaches.  Psychiatric/Behavioral: Positive for depression (Stabilized with medication prior to discharge) and substance abuse (Hx. Benzodiazepine use disorder). Negative for hallucinations, memory loss and suicidal ideas. The patient has insomnia (Stabilized with medication prior to discharge). The patient is not nervous/anxious (Stable).     Blood pressure 97/60, pulse (!) 109, temperature 98 F (36.7 C),  temperature source Oral, resp. rate 16, height 5\' 3"  (1.6 m), weight 49.9 kg, last menstrual period 07/24/2019.Body mass index is 19.49 kg/m.  See Md's discharge SRA   Have you used any form of tobacco in the last 30 days? (Cigarettes, Smokeless Tobacco, Cigars, and/or Pipes): Yes  Has this patient used any form of tobacco in the last 30 days? (Cigarettes, Smokeless Tobacco, Cigars, and/or Pipes): N/A  Blood Alcohol level:  Lab Results  Component Value Date   ETH <10 07/29/2019   Metabolic Disorder Labs:  Lab Results  Component Value Date   HGBA1C 4.8 08/01/2019   MPG 91.06 08/01/2019   No results found for: PROLACTIN Lab Results  Component Value Date   CHOL 91 08/01/2019   TRIG 41 08/01/2019   HDL 39 (L) 08/01/2019   CHOLHDL 2.3 08/01/2019   VLDL 8 08/01/2019   LDLCALC 44 08/01/2019   See Psychiatric Specialty Exam and Suicide Risk Assessment completed by Attending Physician prior to discharge.  Discharge destination:  Home  Is patient on multiple antipsychotic therapies at discharge:  No   Has Patient had three or more failed trials of antipsychotic monotherapy by history:  No  Recommended Plan for Multiple Antipsychotic Therapies: NA  Allergies as of 08/02/2019   No Known Allergies     Medication List    STOP taking these medications   ALPRAZolam 1 MG tablet Commonly known as: XANAX   ondansetron 4 MG disintegrating tablet Commonly known as: ZOFRAN-ODT   traMADol 50 MG tablet Commonly known as: ULTRAM     TAKE these medications     Indication  hydrOXYzine 10 MG tablet Commonly known as: ATARAX/VISTARIL Take 1 tablet (10 mg total) by mouth 3 (three) times daily as needed for anxiety.  Indication: Feeling Anxious   sertraline 25 MG tablet Commonly known as: ZOLOFT Take 3 tablets (75 mg total) by mouth daily. For depression Start taking on: August 03, 2019  Indication: Major Depressive Disorder   traZODone 50 MG tablet Commonly known as:  DESYREL Take 1 tablet (50 mg total) by mouth at bedtime as needed for sleep.  Indication: Trouble Sleeping      Follow-up Information    [facility]. Go on 08/04/2019.   Why: Therapy appointment is Friday, 10/2 at 10:00a.  Contact information: [address] [place], [place] Phone: [phone] fx: [phone]       [facility] Follow up on 08/07/2019.   Specialty: Behavioral Health Why: Medication management appointment with Dr. 10/07/2019 is Monday, 10/5 at 1:00p.  Appt will be virtual and an email will be sent to you with the information. Contact information: [address] [place] [phone]         Follow-up recommendations: Activity:  As tolerated Diet: As recommended by your primary care doctor. Keep all scheduled follow-up appointments as recommended.    Comments: Prescriptions given at discharge.  Patient agreeable to plan.  Given opportunity to ask questions.  Appears to  feel comfortable with discharge denies any current suicidal or homicidal thought. Patient is also instructed prior to discharge to: Take all medications as prescribed by his/her mental healthcare provider. Report any adverse effects and or reactions from the medicines to his/her outpatient provider promptly. Patient has been instructed & cautioned: To not engage in alcohol and or illegal drug use while on prescription medicines. In the event of worsening symptoms, patient is instructed to call the crisis hotline, 911 and or go to the nearest ED for appropriate evaluation and treatment of symptoms. To follow-up with his/her primary care provider for your other medical issues, concerns and or health care needs.  Signed: Armandina StammerAgnes Nwoko, NP, PMHNP, FNP-BC 08/02/2019, 9:50 AM   Patient seen, Suicide Assessment Completed.  Disposition Plan Reviewed

## 2019-08-02 NOTE — BHH Suicide Risk Assessment (Signed)
[facility] Discharge Suicide Risk Assessment   Principal Problem: Severe recurrent major depression without psychotic features [facility]) Discharge Diagnoses: Principal Problem:   Severe recurrent major depression without psychotic features ([place])   Total Time spent with patient: 30 minutes  Musculoskeletal: Strength & Muscle Tone: within normal limits Gait & Station: normal Patient leans: N/A  Psychiatric Specialty Exam: ROS no headache, no chest pain, no shortness of breath, no cough, reports mild nausea.  Reports dizziness earlier this AM, now resolved .   Blood pressure 97/60, pulse (!) 109, temperature 98 F (36.7 C), temperature source Oral, resp. rate 16, height 5\' 3"  (1.6 m), weight 49.9 kg, last menstrual period 07/24/2019.Body mass index is 19.49 kg/m.  General Appearance: Well Groomed  Eye Contact::  Good  Speech:  Normal [id number]  Volume:  Normal  Mood:  reports mood is improved and better than on admission. Presents with a fuller range of  affect   Affect:  brighter, more reactive   Thought Process:  Linear and Descriptions of Associations: Intact  Orientation:  Full (Time, Place, and Person)  Thought Content:  no hallucinations, no delusions, not internally preoccupied   Suicidal Thoughts:  No denies suicidal or self injurious ideations, denies homicidal or violent ideations  Homicidal Thoughts:  No  Memory:  recent and remote grossly intact  Judgement:  Other:  improving   Insight:  improving  Psychomotor Activity:  Normal  Concentration:  Good  Recall:  Good  Fund of Knowledge:Good  Language: Good  Akathisia:  Negative  Handed:  Right  AIMS (if indicated):     Assets:  Communication Skills Desire for Improvement Resilience  Sleep:  Number of Hours: 6.25  Cognition: WNL  ADL's:  Intact   Mental Status Per Nursing Assessment::   On Admission:  Suicidal ideation indicated by patient, Self-harm behaviors  Demographic Factors:  42, single, lives with roommates, in  college, 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  Loss Factors: Relationship stressors with friends/roommates   Historical Factors: No prior psychiatric admission, history of depression and past history of suicide attempt in the past   Risk Reduction Factors:   Sense of responsibility to family, Living with another person, especially a relative and Positive coping skills or problem solving skills  Continued Clinical Symptoms:  At this time patient is alert , attentive, well related , calm, mood improved , affect appropriate, no thought disorder, no suicidal or self injurious ideations, no homicidal or violent ideations, no hallucinations, no delusions , not internally preoccupied, future oriented . Denies medication side effects- she does reports some nausea earlier today/ now resolved, feels it may have been related to Trazodone PRN rather than to Zoloft . Side effects reviewed .  No disruptive or agitated behaviors on unit. Interacting appropriately with peers, pleasant on approach. Patient reports she has a number of friends she can contact for support / has a good support network.   Cognitive Features That Contribute To Risk:  No gross cognitive deficits noted upon discharge. Is alert , attentive, and oriented x 3   Suicide Risk:  Mild:  Suicidal ideation of limited frequency, intensity, duration, and specificity.  There are no identifiable plans, no associated intent, mild dysphoria and related symptoms, good self-control (both objective and subjective assessment), few other risk factors, and identifiable protective factors, including available and accessible social support.  Follow-up Information    [facility]. Go on 08/04/2019.   Why: Therapy appointment is Friday, 10/2 at 10:00a.  Contact information: [address]  [place], [place] [address] Phone: [phone] fx: [phone]       [facility] Follow up on 08/07/2019.   Specialty: Behavioral  Health Why: Medication management appointment with Dr. Demetrius Charity is Monday, 10/5 at 1:00p.  Appt will be virtual and an email will be sent to you with the information. Contact information: [address] [place] [address] [phone]          Plan Of Care/Follow-up recommendations:  Activity:  as tolerated  Diet:  regular Tests:  NA Other:  See below  Patient is expressing readiness for discharge - no grounds for involuntary commitment at this time. She is leaving unit in good spirits. Plans to return home. Plans to follow up as above.   Craige Cotta, MD 08/02/2019, 9:55 AM

## 2019-08-02 NOTE — Progress Notes (Signed)
  [facility] Adult Case Management Discharge Plan :  Will you be returning to the same living situation after discharge:  Yes,  home. At discharge, do you have transportation home?: Yes,  roommate will pick up after 2:00pm. Do you have the ability to pay for your medications: Yes,  BCBS.  Release of information consent forms completed and in the chart.  Patient to Follow up at: Follow-up Information    [facility]. Go on 08/04/2019.   Why: Therapy appointment is Friday, 10/2 at 10:00a.  Contact information: [place], [place] [address] Phone: [phone] fx: [phone]       [facility] Follow up on 08/07/2019.   Specialty: Behavioral Health Why: Medication management appointment with Dr. Mamie Nick is Monday, 10/5 at 1:00p.  Appt will be virtual and an email will be sent to you with the information. Contact information: [place] [phone]          Next level of care provider has access to [employer] and Suicide Prevention discussed: Yes,  with patient. Two attempts to reach roommate.  Have you used any form of tobacco in the last 30 days? (Cigarettes, Smokeless Tobacco, Cigars, and/or Pipes): Yes  Has patient been referred to the Quitline?: Patient refused referral  Patient has been referred for addiction treatment: Yes  Joellen Jersey, [place] 08/02/2019, 10:26 AM

## 2019-08-02 NOTE — BHH Group Notes (Signed)
Occupational Therapy Group Note  Date:  08/02/2019 Time:  3:01 PM  Group Topic/Focus:  Self Esteem Action Plan:   The focus of this group is to help patients create a plan to continue to build self-esteem after discharge.  Participation Level:  Active  Participation Quality:  Appropriate  Affect:  Depressed  Cognitive:  Alert  Insight: Improving  Engagement in Group:  Engaged  Modes of Intervention:  Activity, Discussion, Education and Socialization  Additional Comments:    S: My self esteem is low  O:Education given on self esteem and how it relates to daily experiences. Pt asked to give definition of self esteem, and current rating of self esteem. Positive and negative contributors of self esteem to be brainstormed within group in relation to personal experiences. Positive thinking activity then completed for pt to identify several positive traits about themselves. Pt asked to share at end of session.   A: Pt presents with depressed affect, shares her self esteem is low because her roommate will not answer her calls. She shares that her cat increases her self esteem. Completed A-Z sheet.  P: OT group will be x1 per week while pt in [place], MSOT, OTR/L [place] Office: [place] 08/02/2019, 3:01 PM

## 2019-08-03 ENCOUNTER — Encounter (HOSPITAL_COMMUNITY): Payer: Self-pay | Admitting: Emergency Medicine

## 2019-08-03 ENCOUNTER — Emergency Department (HOSPITAL_COMMUNITY)
Admission: EM | Admit: 2019-08-03 | Discharge: 2019-08-03 | Disposition: A | Discharge status: Home / Self Care | Payer: BC Managed Care – PPO | Attending: Emergency Medicine | Admitting: Emergency Medicine

## 2019-08-03 ENCOUNTER — Other Ambulatory Visit: Payer: Self-pay

## 2019-08-03 DIAGNOSIS — R112 Nausea with vomiting, unspecified: Secondary | ICD-10-CM | POA: Diagnosis present

## 2019-08-03 DIAGNOSIS — E860 Dehydration: Secondary | ICD-10-CM

## 2019-08-03 DIAGNOSIS — Z79899 Other long term (current) drug therapy: Secondary | ICD-10-CM | POA: Diagnosis not present

## 2019-08-03 LAB — CBC
HCT: 41.6 % (ref 36.0–46.0)
Hemoglobin: 14.3 g/dL (ref 12.0–15.0)
MCH: 31.9 pg (ref 26.0–34.0)
MCHC: 34.4 g/dL (ref 30.0–36.0)
MCV: 92.9 fL (ref 80.0–100.0)
Platelets: 242 10*3/uL (ref 150–400)
RBC: 4.48 MIL/uL (ref 3.87–5.11)
RDW: 11.8 % (ref 11.5–15.5)
WBC: 7.1 10*3/uL (ref 4.0–10.5)
nRBC: 0 % (ref 0.0–0.2)

## 2019-08-03 LAB — COMPREHENSIVE METABOLIC PANEL
ALT: 13 U/L (ref 0–44)
AST: 13 U/L — ABNORMAL LOW (ref 15–41)
Albumin: 5.2 g/dL — ABNORMAL HIGH (ref 3.5–5.0)
Alkaline Phosphatase: 55 U/L (ref 38–126)
Anion gap: 11 (ref 5–15)
BUN: 10 mg/dL (ref 6–20)
CO2: 23 mmol/L (ref 22–32)
Calcium: 9.5 mg/dL (ref 8.9–10.3)
Chloride: 104 mmol/L (ref 98–111)
Creatinine, Ser: 0.67 mg/dL (ref 0.44–1.00)
GFR calc Af Amer: >60 mL/min (ref >60)
GFR calc non Af Amer: >60 mL/min (ref >60)
Glucose, Bld: 95 mg/dL (ref 70–99)
Potassium: 3.8 mmol/L (ref 3.5–5.1)
Sodium: 138 mmol/L (ref 135–145)
Total Bilirubin: 1.2 mg/dL (ref 0.3–1.2)
Total Protein: 8 g/dL (ref 6.5–8.1)

## 2019-08-03 LAB — I-STAT BETA HCG BLOOD, ED (MC, WL, AP ONLY): I-stat hCG, quantitative: <5 m[IU]/mL (ref <5)

## 2019-08-03 LAB — LIPASE, BLOOD: Lipase: 21 U/L (ref 11–51)

## 2019-08-03 MED ORDER — ONDANSETRON HCL 4 MG/2ML IJ SOLN
4.0000 mg | Freq: Once | INTRAMUSCULAR | Status: AC
  Administered 2019-08-03: 4 mg via INTRAVENOUS
  Filled 2019-08-03: qty 2

## 2019-08-03 MED ORDER — SODIUM CHLORIDE 0.9 % IV BOLUS
2000.0000 mL | Freq: Once | INTRAVENOUS | Status: AC
  Administered 2019-08-03: 16:00:00 2000 mL via INTRAVENOUS

## 2019-08-03 MED ORDER — SODIUM CHLORIDE 0.9% FLUSH
3.0000 mL | Freq: Once | INTRAVENOUS | Status: AC
  Administered 2019-08-03: 16:00:00 3 mL via INTRAVENOUS

## 2019-08-03 MED ORDER — SODIUM CHLORIDE 0.9 % IV SOLN: INTRAVENOUS | Status: DC

## 2019-08-03 NOTE — ED Triage Notes (Signed)
Pt reports she was discharged from [facility] yesterday and reports been vomiting after drinking anything.

## 2019-08-03 NOTE — ED Notes (Signed)
Pt verbalizes understanding of DC instructions. Pt belongings returned and is ambulatory out of ED.  

## 2019-08-03 NOTE — ED Notes (Signed)
Pt made ware need urine sample. 

## 2019-08-03 NOTE — ED Provider Notes (Signed)
[facility] DEPT Provider Note   CSN: [id number] Arrival date & time: 08/03/19  1311     History   Chief Complaint Chief Complaint  Patient presents with  . Emesis    HPI Alexandria [name] is a 23 y.o. female.     23 year old female presents with persistent nausea vomiting since being diagnosed from behavioral hospital several days ago.  Patient states she was restarted on new medications.  Denies any abdominal discomfort.  States that she feels dizzy when she stands up.  No recent fever or chills.  No treatment use prior to arrival.     Past Medical History:  Diagnosis Date  . ADHD   . Depression   . PTSD (post-traumatic stress disorder)     Patient Active Problem List   Diagnosis Date Noted  . Overdose of benzodiazepine, intentional self-harm, initial encounter ([place])   . Severe recurrent major depression without psychotic features ([place]) 07/30/2019    History reviewed. No pertinent surgical history.   OB History    Gravida  1   Para      Term      Preterm      AB      Living        SAB      TAB      Ectopic      Multiple      Live Births               Home Medications    Prior to Admission medications   Medication Sig Start Date End Date Taking? Authorizing Provider  hydrOXYzine (ATARAX/VISTARIL) 10 MG tablet Take 1 tablet (10 mg total) by mouth 3 (three) times daily as needed for anxiety. 08/02/19   Lindell Spar I, NP  sertraline (ZOLOFT) 25 MG tablet Take 3 tablets (75 mg total) by mouth daily. For depression 08/03/19   Lindell Spar I, NP  traZODone (DESYREL) 50 MG tablet Take 1 tablet (50 mg total) by mouth at bedtime as needed for sleep. 08/02/19   Encarnacion Slates, NP    Family History No family history on file.  Social History Social History   Tobacco Use  . Smoking status: Never Smoker  . Smokeless tobacco: Never Used  Substance Use Topics  . Alcohol use: Yes    Frequency: Never  . Drug use: Never      Allergies   Patient has no known allergies.   Review of Systems Review of Systems  All other systems reviewed and are negative.    Physical Exam Updated Vital Signs BP 103/79   Pulse 81   Temp 98.7 F (37.1 C)   Resp 16   LMP 07/24/2019   SpO2 97%   Physical Exam Vitals signs and nursing note reviewed.  Constitutional:      General: She is not in acute distress.    Appearance: Normal appearance. She is well-developed. She is not toxic-appearing.  HENT:     Head: Normocephalic and atraumatic.  Eyes:     General: Lids are normal.     Conjunctiva/sclera: Conjunctivae normal.     Pupils: Pupils are equal, round, and reactive to light.  Neck:     Musculoskeletal: Normal range of motion and neck supple.     Thyroid: No thyroid mass.     Trachea: No tracheal deviation.  Cardiovascular:     Rate and Rhythm: Normal rate and regular rhythm.     Heart sounds: Normal heart sounds. No murmur.  No gallop.   Pulmonary:     Effort: Pulmonary effort is normal. No respiratory distress.     Breath sounds: Normal breath sounds. No stridor. No decreased breath sounds, wheezing, rhonchi or rales.  Abdominal:     General: Bowel sounds are normal. There is no distension.     Palpations: Abdomen is soft.     Tenderness: There is no abdominal tenderness. There is no rebound.  Musculoskeletal: Normal range of motion.        General: No tenderness.  Skin:    General: Skin is warm and dry.     Findings: No abrasion or rash.  Neurological:     Mental Status: She is alert and oriented to person, place, and time.     GCS: GCS eye subscore is 4. GCS verbal subscore is 5. GCS motor subscore is 6.     Cranial Nerves: No cranial nerve deficit.     Sensory: No sensory deficit.  Psychiatric:        Attention and Perception: Attention normal.        Mood and Affect: Affect is flat.        Speech: Speech normal.        Behavior: Behavior normal.      ED Treatments / Results  Labs  (all labs ordered are listed, but only abnormal results are displayed) Labs Reviewed  COMPREHENSIVE METABOLIC PANEL - Abnormal; Notable for the following components:      Result Value   Albumin 5.2 (*)    AST 13 (*)    All other components within normal limits  LIPASE, BLOOD  CBC  URINALYSIS, ROUTINE W REFLEX MICROSCOPIC  I-STAT BETA HCG BLOOD, ED ([facility], [facility], [facility] ONLY)    EKG None  Radiology No results found.  Procedures Procedures (including critical care time)  Medications Ordered in ED Medications  sodium chloride flush (NS) 0.9 % injection 3 mL (has no administration in time range)  sodium chloride 0.9 % bolus 2,000 mL (has no administration in time range)  0.9 %  sodium chloride infusion (has no administration in time range)  ondansetron (ZOFRAN) injection 4 mg (has no administration in time range)     Initial Impression / Assessment and Plan / ED Course  I have reviewed the triage vital signs and the nursing notes.  Pertinent labs & imaging results that were available during my care of the patient were reviewed by me and considered in my medical decision making (see chart for details).        Patient given IV fluids here and feels better.  Labs are reassuring.  She stable for discharge  Final Clinical Impressions(s) / ED Diagnoses   Final diagnoses:  None    ED Discharge Orders    None       Lorre Nick, MD 08/03/19 1728

## 2019-08-03 NOTE — ED Notes (Signed)
Pt reminded need for urine sample but denies urge to void

## 2019-08-07 ENCOUNTER — Ambulatory Visit (HOSPITAL_COMMUNITY): Payer: BC Managed Care – PPO | Admitting: Psychiatry

## 2019-08-07 ENCOUNTER — Other Ambulatory Visit: Payer: Self-pay

## 2019-09-07 ENCOUNTER — Telehealth (HOSPITAL_COMMUNITY): Payer: Self-pay

## 2019-09-07 ENCOUNTER — Other Ambulatory Visit (HOSPITAL_COMMUNITY): Payer: Self-pay | Admitting: Psychiatry

## 2019-09-07 MED ORDER — TRAZODONE HCL 50 MG PO TABS: 50.0000 mg | ORAL_TABLET | Freq: Every evening | ORAL | 0 refills | Status: DC | PRN

## 2019-09-07 MED ORDER — SERTRALINE HCL 25 MG PO TABS: 75.0000 mg | ORAL_TABLET | Freq: Every day | ORAL | 0 refills | Status: DC

## 2019-09-07 NOTE — Telephone Encounter (Signed)
Patient was discharged from the hospital with a 30 day supply of medication, she sees you as a new patient on Monday. Patient would like to know if you can fill her Zoloft and her Trazodone as she is out today. Please review and advise, thank you

## 2019-09-07 NOTE — Telephone Encounter (Signed)
Done

## 2019-09-11 ENCOUNTER — Encounter (HOSPITAL_COMMUNITY): Payer: Self-pay | Admitting: Psychiatry

## 2019-09-11 ENCOUNTER — Ambulatory Visit (INDEPENDENT_AMBULATORY_CARE_PROVIDER_SITE_OTHER): Payer: BC Managed Care – PPO | Admitting: Psychiatry

## 2019-09-11 ENCOUNTER — Other Ambulatory Visit: Payer: Self-pay

## 2019-09-11 DIAGNOSIS — F330 Major depressive disorder, recurrent, mild: Secondary | ICD-10-CM

## 2019-09-11 DIAGNOSIS — G4700 Insomnia, unspecified: Secondary | ICD-10-CM

## 2019-09-11 DIAGNOSIS — F4312 Post-traumatic stress disorder, chronic: Secondary | ICD-10-CM | POA: Diagnosis not present

## 2019-09-11 MED ORDER — TRAZODONE HCL 100 MG PO TABS: 100.0000 mg | ORAL_TABLET | Freq: Every evening | ORAL | 0 refills | Status: DC | PRN

## 2019-09-11 MED ORDER — SERTRALINE HCL 25 MG PO TABS: 75.0000 mg | ORAL_TABLET | Freq: Every day | ORAL | 0 refills | Status: DC
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
## 2019-10-11 ENCOUNTER — Other Ambulatory Visit (HOSPITAL_COMMUNITY): Payer: Self-pay

## 2019-10-16 ENCOUNTER — Other Ambulatory Visit (HOSPITAL_COMMUNITY): Payer: Self-pay | Admitting: Psychiatry

## 2019-10-16 ENCOUNTER — Telehealth (HOSPITAL_COMMUNITY): Payer: Self-pay

## 2019-10-16 MED ORDER — SERTRALINE HCL 25 MG PO TABS: 75.0000 mg | ORAL_TABLET | Freq: Every day | ORAL | 0 refills | Status: DC

## 2019-10-16 NOTE — Telephone Encounter (Signed)
I did resend it to [facility] (it was send to [facility] earlier).

## 2019-10-16 NOTE — Telephone Encounter (Signed)
Medication management - Fax received from pt's [facility] for a refill of her newly prescribed Sertraline, started on 09/11/19 with no refills and pt does not return until 10/24/19

## 2019-10-24 ENCOUNTER — Other Ambulatory Visit: Payer: Self-pay

## 2019-10-24 ENCOUNTER — Ambulatory Visit (INDEPENDENT_AMBULATORY_CARE_PROVIDER_SITE_OTHER): Payer: BC Managed Care – PPO | Admitting: Psychiatry

## 2019-10-24 DIAGNOSIS — F3341 Major depressive disorder, recurrent, in partial remission: Secondary | ICD-10-CM | POA: Diagnosis not present

## 2019-10-24 DIAGNOSIS — F4312 Post-traumatic stress disorder, chronic: Secondary | ICD-10-CM

## 2019-10-24 MED ORDER — SERTRALINE HCL 25 MG PO TABS: 75.0000 mg | ORAL_TABLET | Freq: Every day | ORAL | 2 refills | Status: DC
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
## 2020-01-22 ENCOUNTER — Other Ambulatory Visit: Payer: Self-pay

## 2020-01-22 ENCOUNTER — Ambulatory Visit (INDEPENDENT_AMBULATORY_CARE_PROVIDER_SITE_OTHER): Payer: BC Managed Care – PPO | Admitting: Psychiatry

## 2020-01-22 DIAGNOSIS — F4312 Post-traumatic stress disorder, chronic: Secondary | ICD-10-CM | POA: Diagnosis not present

## 2020-01-22 DIAGNOSIS — F909 Attention-deficit hyperactivity disorder, unspecified type: Secondary | ICD-10-CM

## 2020-01-22 DIAGNOSIS — F3341 Major depressive disorder, recurrent, in partial remission: Secondary | ICD-10-CM

## 2020-01-22 MED ORDER — AMPHETAMINE-DEXTROAMPHET ER 20 MG PO CP24: 20.0000 mg | ORAL_CAPSULE | ORAL | 0 refills | Status: DC

## 2020-01-22 MED ORDER — SERTRALINE HCL 25 MG PO TABS: 75.0000 mg | ORAL_TABLET | Freq: Every day | ORAL | 2 refills | Status: DC

## 2020-01-22 MED ORDER — HYDROXYZINE HCL 10 MG PO TABS: 10.0000 mg | ORAL_TABLET | Freq: Three times a day (TID) | ORAL | 0 refills | Status: DC | PRN
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
## 2020-03-08 ENCOUNTER — Telehealth (HOSPITAL_COMMUNITY): Payer: Self-pay | Admitting: *Deleted

## 2020-03-08 NOTE — Telephone Encounter (Signed)
Pt called requesting a refill of Adderall XR 20mg  last ordered on 01/22/20. Pt says that the medication is really helping and would to continue on it. Please send to [facility] on [facility] in [place].

## 2020-03-10 ENCOUNTER — Other Ambulatory Visit (HOSPITAL_COMMUNITY): Payer: Self-pay | Admitting: Psychiatry

## 2020-03-11 ENCOUNTER — Other Ambulatory Visit (HOSPITAL_COMMUNITY): Payer: Self-pay | Admitting: Psychiatry

## 2020-03-11 MED ORDER — AMPHETAMINE-DEXTROAMPHET ER 20 MG PO CP24: 20.0000 mg | ORAL_CAPSULE | ORAL | 0 refills | Status: DC

## 2020-03-11 NOTE — Telephone Encounter (Signed)
Done

## 2020-03-13 ENCOUNTER — Other Ambulatory Visit (HOSPITAL_COMMUNITY): Payer: Self-pay | Admitting: *Deleted

## 2020-03-13 MED ORDER — HYDROXYZINE HCL 10 MG PO TABS: 10.0000 mg | ORAL_TABLET | Freq: Three times a day (TID) | ORAL | 0 refills | Status: DC | PRN

## 2020-04-23 ENCOUNTER — Other Ambulatory Visit: Payer: Self-pay

## 2020-04-23 ENCOUNTER — Telehealth (INDEPENDENT_AMBULATORY_CARE_PROVIDER_SITE_OTHER): Payer: BC Managed Care – PPO | Admitting: Psychiatry

## 2020-04-23 DIAGNOSIS — F3342 Major depressive disorder, recurrent, in full remission: Secondary | ICD-10-CM

## 2020-04-23 DIAGNOSIS — F909 Attention-deficit hyperactivity disorder, unspecified type: Secondary | ICD-10-CM | POA: Diagnosis not present

## 2020-04-23 MED ORDER — AMPHETAMINE-DEXTROAMPHETAMINE 10 MG PO TABS: 10.0000 mg | ORAL_TABLET | Freq: Every day | ORAL | 0 refills | Status: DC

## 2020-04-23 MED ORDER — SERTRALINE HCL 25 MG PO TABS: ORAL_TABLET | ORAL | 2 refills | Status: DC

## 2020-04-23 MED ORDER — AMPHETAMINE-DEXTROAMPHET ER 20 MG PO CP24: 20.0000 mg | ORAL_CAPSULE | ORAL | 0 refills | Status: DC
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
## 2020-07-24 ENCOUNTER — Other Ambulatory Visit: Payer: Self-pay

## 2020-07-24 ENCOUNTER — Telehealth (INDEPENDENT_AMBULATORY_CARE_PROVIDER_SITE_OTHER): Payer: BC Managed Care – PPO | Admitting: Psychiatry

## 2020-07-24 DIAGNOSIS — F909 Attention-deficit hyperactivity disorder, unspecified type: Secondary | ICD-10-CM | POA: Diagnosis not present

## 2020-07-24 DIAGNOSIS — F4312 Post-traumatic stress disorder, chronic: Secondary | ICD-10-CM

## 2020-07-24 DIAGNOSIS — F3342 Major depressive disorder, recurrent, in full remission: Secondary | ICD-10-CM

## 2020-07-24 MED ORDER — SERTRALINE HCL 25 MG PO TABS
ORAL_TABLET | ORAL | 5 refills | Status: DC
Start: 2020-07-24 — End: 2020-10-23

## 2020-07-24 MED ORDER — AMPHETAMINE-DEXTROAMPHETAMINE 10 MG PO TABS: 10.0000 mg | ORAL_TABLET | Freq: Every day | ORAL | 0 refills | Status: DC

## 2020-07-24 MED ORDER — AMPHETAMINE-DEXTROAMPHET ER 20 MG PO CP24: 20.0000 mg | ORAL_CAPSULE | ORAL | 0 refills | Status: DC

## 2020-07-24 MED ORDER — RISPERIDONE 0.5 MG PO TBDP: 0.5000 mg | ORAL_TABLET | Freq: Every day | ORAL | 2 refills | Status: DC | PRN
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
## 2020-10-20 IMAGING — CT CT HEAD W/O CM
3 series · 15 of 47 positions shown, 18 images · non-contrast
Comparison: None.

CLINICAL DATA: Attempted suicide, possible fall

EXAM:
CT HEAD WITHOUT CONTRAST
CT CERVICAL SPINE WITHOUT CONTRAST
TECHNIQUE: Multidetector CT imaging of the head and cervical spine was
performed following the standard protocol without intravenous
contrast. Multiplanar CT image reconstructions of the cervical spine
were also generated.

[Series 3: head wo · axial · 0.42mm/px · z∈[-166,-26]mm · 9 of 34 slices shown, 12 images]
[im 3/34  brain]
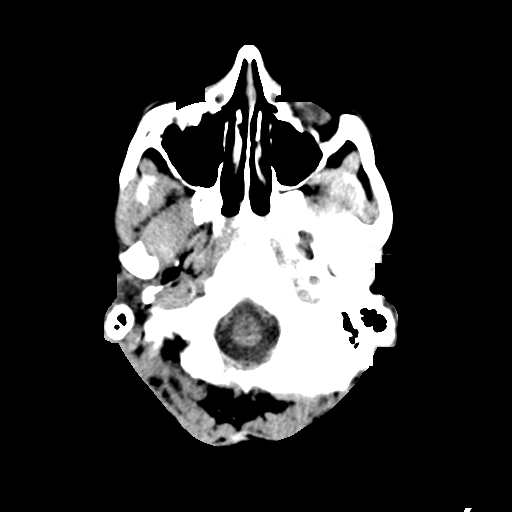
[im 3/34  bone]
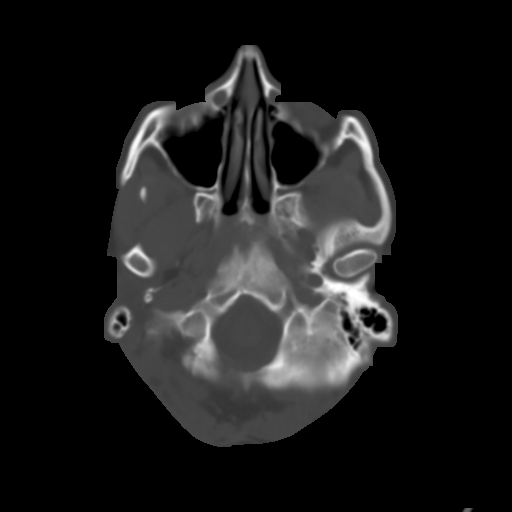
[im 6/34  brain]
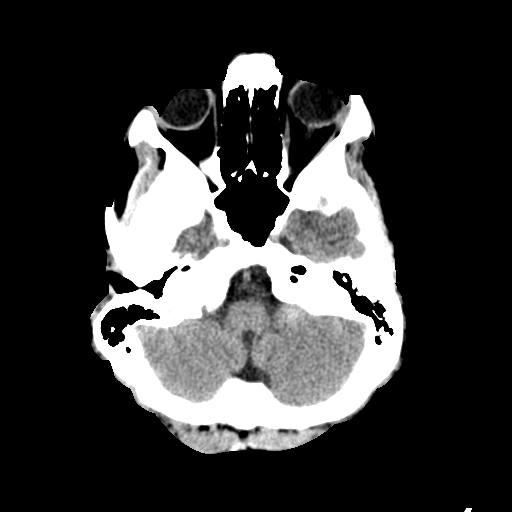
[im 10/34  brain]
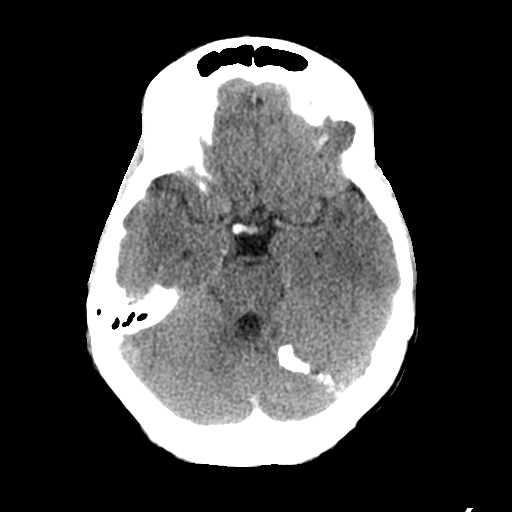
[im 13/34  brain]
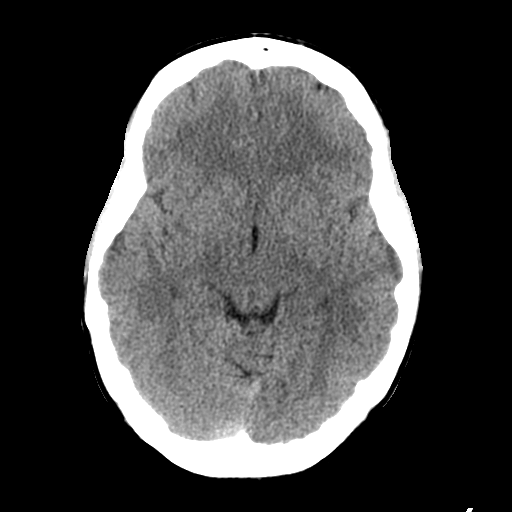
[im 18/34  brain]
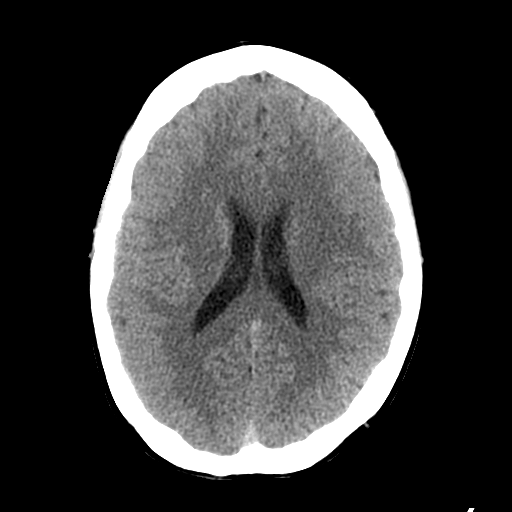
[im 18/34  bone]
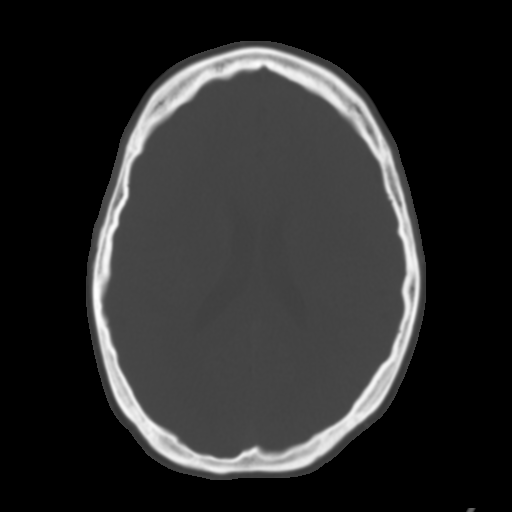
[im 21/34  brain]
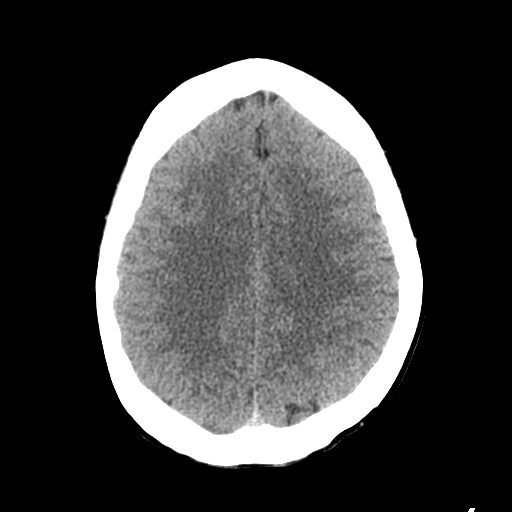
[im 24/34  brain]
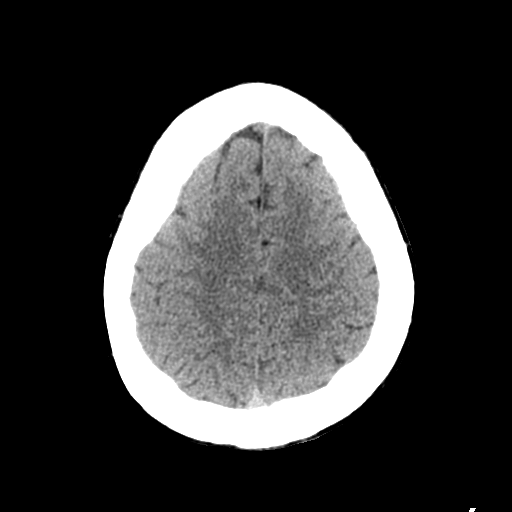
[im 28/34  brain]
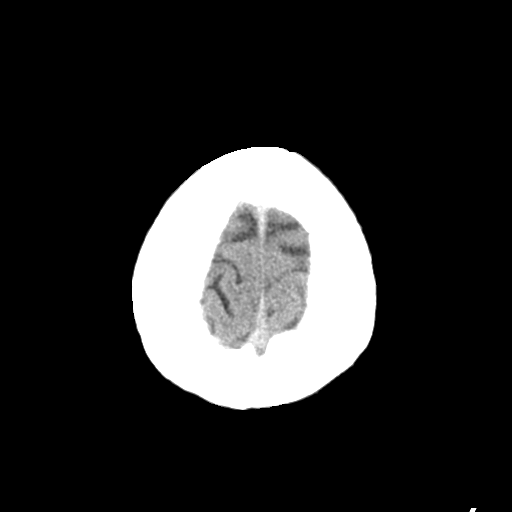
[im 31/34  brain]
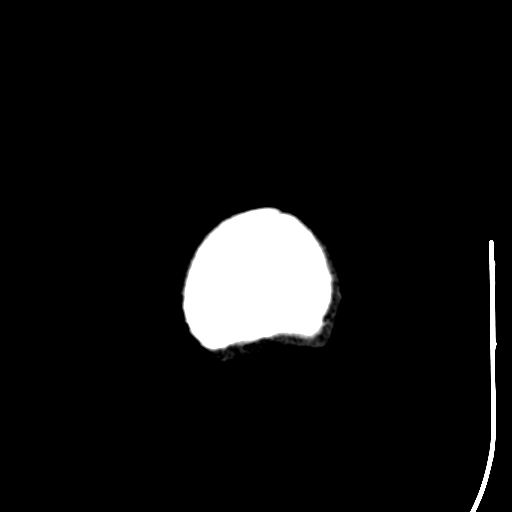
[im 31/34  bone]
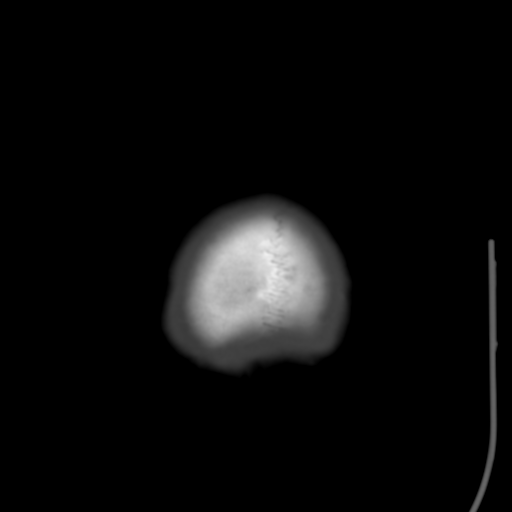

[Series 6: coronal soft tissue · coronal · 0.32mm/px · 3 of 73 slices shown]
[im 25/73  brain]
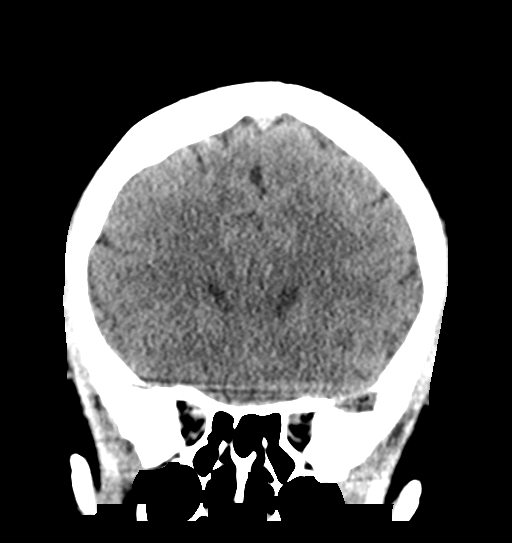
[im 33/73  brain]
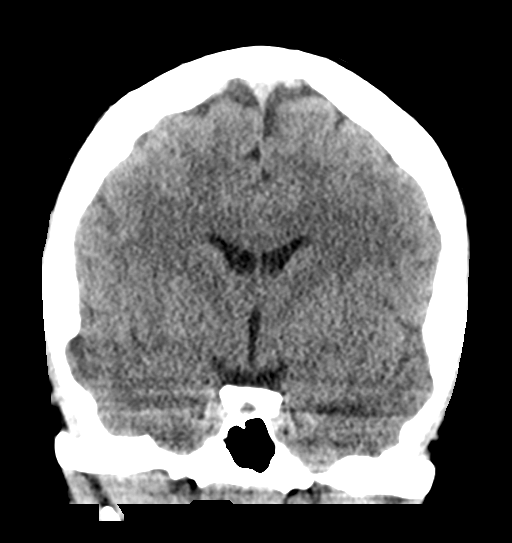
[im 41/73  brain]
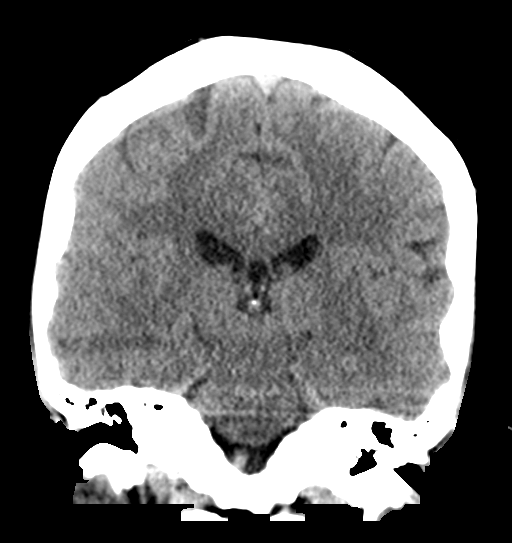

[Series 7: sagittal soft tissue · sagittal · 0.36mm/px · 3 of 55 slices shown]
[im 19/55  brain]
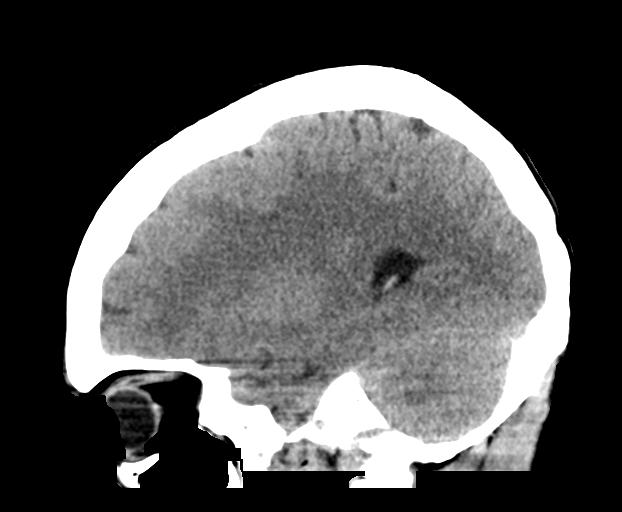
[im 28/55  brain]
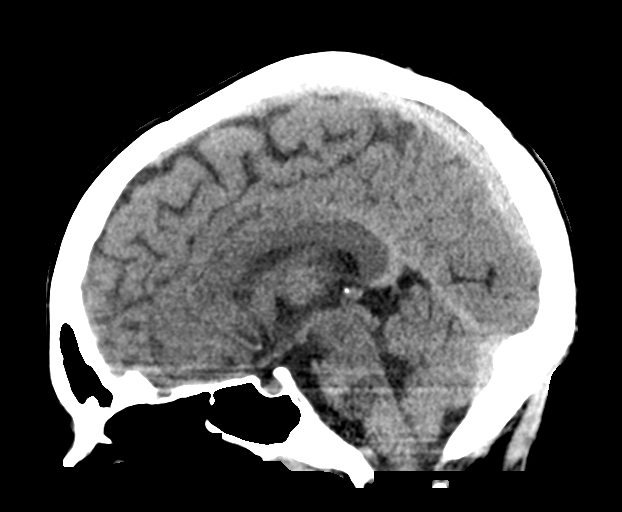
[im 37/55  brain]
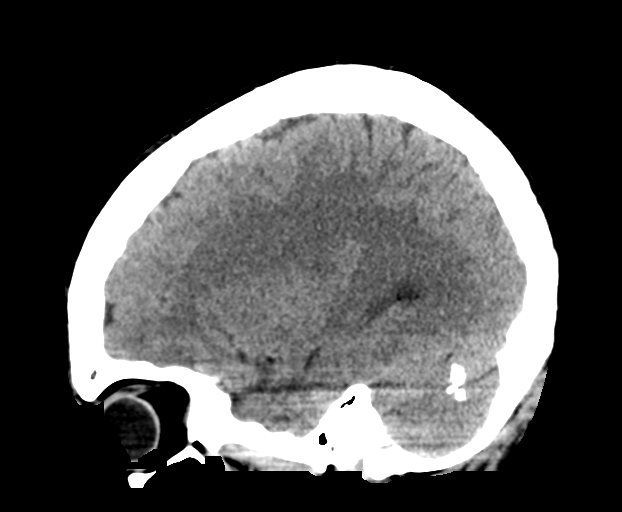

[15 of 47 positions shown; findings below may reference images not displayed]

FINDINGS: CT HEAD FINDINGS

Brain: No acute territorial infarction, hemorrhage, or intracranial
mass. Coarse calcification within the left cerebellar hemisphere
without associated mass or mass effect. Normal ventricle size

Vascular: No hyperdense vessels.  No unexpected calcification

Skull: Normal. Negative for fracture or focal lesion.

Sinuses/Orbits: No acute finding.

Other: None

CT CERVICAL SPINE FINDINGS

Alignment: Mild reversal of cervical lordosis. No subluxation. Facet
alignment within normal limits.

Skull base and vertebrae: No acute fracture. No primary bone lesion
or focal pathologic process. Incomplete fusion posterior arch of C1.

Soft tissues and spinal canal: No prevertebral fluid or swelling. No
visible canal hematoma.

Disc levels:  Within normal limits

Upper chest: Negative.

Other: Subcentimeter hypodense nodule right lobe of thyroid
IMPRESSION: 1. No CT evidence for acute intracranial abnormality. Coarse
dystrophic appearing calcification left cerebellar hemisphere
possibly due to remote insult. No associated mass or mass effect.
2. Mild reversal of cervical lordosis.  No acute osseous abnormality

## 2020-10-23 ENCOUNTER — Other Ambulatory Visit: Payer: Self-pay

## 2020-10-23 ENCOUNTER — Telehealth (INDEPENDENT_AMBULATORY_CARE_PROVIDER_SITE_OTHER): Payer: BC Managed Care – PPO | Admitting: Psychiatry

## 2020-10-23 DIAGNOSIS — F909 Attention-deficit hyperactivity disorder, unspecified type: Secondary | ICD-10-CM

## 2020-10-23 DIAGNOSIS — F3342 Major depressive disorder, recurrent, in full remission: Secondary | ICD-10-CM | POA: Diagnosis not present

## 2020-10-23 DIAGNOSIS — F4312 Post-traumatic stress disorder, chronic: Secondary | ICD-10-CM

## 2020-10-23 MED ORDER — AMPHETAMINE-DEXTROAMPHETAMINE 10 MG PO TABS: 10.0000 mg | ORAL_TABLET | Freq: Every day | ORAL | 0 refills | Status: DC

## 2020-10-23 MED ORDER — AMPHETAMINE-DEXTROAMPHET ER 20 MG PO CP24: 20.0000 mg | ORAL_CAPSULE | ORAL | 0 refills | Status: DC

## 2020-10-23 MED ORDER — SERTRALINE HCL 25 MG PO TABS
ORAL_TABLET | ORAL | 5 refills | Status: DC
Start: 2020-10-23 — End: 2020-12-25
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
## 2020-12-25 ENCOUNTER — Telehealth (INDEPENDENT_AMBULATORY_CARE_PROVIDER_SITE_OTHER): Payer: BLUE CROSS/BLUE SHIELD | Admitting: Psychiatry

## 2020-12-25 ENCOUNTER — Other Ambulatory Visit: Payer: Self-pay

## 2020-12-25 DIAGNOSIS — F4312 Post-traumatic stress disorder, chronic: Secondary | ICD-10-CM

## 2020-12-25 DIAGNOSIS — F3342 Major depressive disorder, recurrent, in full remission: Secondary | ICD-10-CM | POA: Diagnosis not present

## 2020-12-25 DIAGNOSIS — F909 Attention-deficit hyperactivity disorder, unspecified type: Secondary | ICD-10-CM

## 2020-12-25 MED ORDER — AMPHETAMINE-DEXTROAMPHETAMINE 10 MG PO TABS: 10.0000 mg | ORAL_TABLET | Freq: Every day | ORAL | 0 refills | Status: DC

## 2020-12-25 MED ORDER — SERTRALINE HCL 100 MG PO TABS
100.0000 mg | ORAL_TABLET | Freq: Every day | ORAL | 5 refills | Status: DC
Start: 2020-12-25 — End: 2022-06-29

## 2020-12-25 MED ORDER — AMPHETAMINE-DEXTROAMPHET ER 20 MG PO CP24: 20.0000 mg | ORAL_CAPSULE | ORAL | 0 refills | Status: DC
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
## 2022-06-25 ENCOUNTER — Telehealth (HOSPITAL_COMMUNITY): Payer: Self-pay

## 2022-06-29 ENCOUNTER — Encounter (HOSPITAL_COMMUNITY): Payer: Self-pay | Admitting: Psychiatry

## 2022-06-29 ENCOUNTER — Ambulatory Visit (HOSPITAL_BASED_OUTPATIENT_CLINIC_OR_DEPARTMENT_OTHER): Payer: Commercial Managed Care - HMO | Admitting: Psychiatry

## 2022-06-29 DIAGNOSIS — F909 Attention-deficit hyperactivity disorder, unspecified type: Secondary | ICD-10-CM | POA: Diagnosis not present

## 2022-06-29 DIAGNOSIS — F3342 Major depressive disorder, recurrent, in full remission: Secondary | ICD-10-CM

## 2022-06-29 DIAGNOSIS — F4312 Post-traumatic stress disorder, chronic: Secondary | ICD-10-CM | POA: Diagnosis not present

## 2022-06-29 MED ORDER — AMPHETAMINE-DEXTROAMPHET ER 10 MG PO CP24: 10.0000 mg | ORAL_CAPSULE | Freq: Every day | ORAL | 0 refills | Status: DC

## 2022-06-29 MED ORDER — AMPHETAMINE-DEXTROAMPHETAMINE 10 MG PO TABS: 10.0000 mg | ORAL_TABLET | Freq: Every day | ORAL | 0 refills | Status: DC

## 2022-06-29 NOTE — Patient Instructions (Addendum)
Thank you for attending your appointment today.  - Will refer for neuropsych testing - Start Adderall XR 10 mg daily - follow up in a month  Please do not make any changes to medications without first discussing with your provider. If you are experiencing a psychiatric emergency, please call 911 or present to your nearest emergency department. Additional crisis, medication management, and therapy resources are included below.  [facility]  [address], [place], [place] [address] [phone] or [phone] [facility] 24/7 FOR ANYONE [address] [place], [place]  [phone] Fax: [phone] [email or web address] *Interpreters available *Accepts all insurance and uninsured for Urgent Care needs *Accepts Medicaid and uninsured for outpatient treatment (below)
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
## 2022-07-29 ENCOUNTER — Encounter (HOSPITAL_COMMUNITY): Payer: Self-pay | Admitting: Psychiatry

## 2022-07-29 ENCOUNTER — Ambulatory Visit (HOSPITAL_BASED_OUTPATIENT_CLINIC_OR_DEPARTMENT_OTHER): Payer: Commercial Managed Care - HMO | Admitting: Psychiatry

## 2022-07-29 DIAGNOSIS — F3342 Major depressive disorder, recurrent, in full remission: Secondary | ICD-10-CM | POA: Diagnosis not present

## 2022-07-29 DIAGNOSIS — F909 Attention-deficit hyperactivity disorder, unspecified type: Secondary | ICD-10-CM

## 2022-07-29 DIAGNOSIS — F4312 Post-traumatic stress disorder, chronic: Secondary | ICD-10-CM | POA: Diagnosis not present

## 2022-07-29 MED ORDER — PROPRANOLOL HCL 10 MG PO TABS: 10.0000 mg | ORAL_TABLET | Freq: Three times a day (TID) | ORAL | 1 refills | Status: DC

## 2022-07-29 MED ORDER — AMPHETAMINE-DEXTROAMPHET ER 10 MG PO CP24
10.0000 mg | ORAL_CAPSULE | Freq: Every day | ORAL | 0 refills | Status: DC
Start: 2022-07-29 — End: 2022-08-26
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
## 2022-08-26 ENCOUNTER — Ambulatory Visit (HOSPITAL_BASED_OUTPATIENT_CLINIC_OR_DEPARTMENT_OTHER): Payer: Commercial Managed Care - HMO | Admitting: Psychiatry

## 2022-08-26 ENCOUNTER — Encounter (HOSPITAL_COMMUNITY): Payer: Self-pay | Admitting: Psychiatry

## 2022-08-26 VITALS — BP 111/78 | HR 68 | Temp 97.9°F | Ht 64.0 in | Wt 119.0 lb

## 2022-08-26 DIAGNOSIS — F3342 Major depressive disorder, recurrent, in full remission: Secondary | ICD-10-CM

## 2022-08-26 DIAGNOSIS — F909 Attention-deficit hyperactivity disorder, unspecified type: Secondary | ICD-10-CM

## 2022-08-26 DIAGNOSIS — F4312 Post-traumatic stress disorder, chronic: Secondary | ICD-10-CM | POA: Diagnosis not present

## 2022-08-26 MED ORDER — AMPHETAMINE-DEXTROAMPHET ER 10 MG PO CP24: 10.0000 mg | ORAL_CAPSULE | Freq: Every day | ORAL | 0 refills | Status: DC

## 2022-08-26 MED ORDER — PROPRANOLOL HCL 10 MG PO TABS: 10.0000 mg | ORAL_TABLET | Freq: Three times a day (TID) | ORAL | 1 refills | Status: DC
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
## 2022-09-28 ENCOUNTER — Emergency Department (HOSPITAL_COMMUNITY)
Admission: EM | Admit: 2022-09-28 | Discharge: 2022-09-28 | Disposition: A | Discharge status: Home / Self Care | Payer: Commercial Managed Care - HMO | Attending: Emergency Medicine | Admitting: Emergency Medicine

## 2022-09-28 ENCOUNTER — Other Ambulatory Visit: Payer: Self-pay

## 2022-09-28 ENCOUNTER — Encounter (HOSPITAL_COMMUNITY): Payer: Self-pay

## 2022-09-28 DIAGNOSIS — S0990XA Unspecified injury of head, initial encounter: Secondary | ICD-10-CM | POA: Diagnosis present

## 2022-09-28 DIAGNOSIS — W208XXA Other cause of strike by thrown, projected or falling object, initial encounter: Secondary | ICD-10-CM | POA: Insufficient documentation

## 2022-09-28 DIAGNOSIS — Y9389 Activity, other specified: Secondary | ICD-10-CM | POA: Diagnosis not present

## 2022-09-28 DIAGNOSIS — S060X0A Concussion without loss of consciousness, initial encounter: Secondary | ICD-10-CM | POA: Diagnosis not present

## 2022-09-28 NOTE — ED Provider Notes (Signed)
[facility] DEPT Provider Note   CSN: [id number] Arrival date & time: 09/28/22  1649     History  Chief Complaint  Patient presents with   Head Injury    Alexandria [name] is a 26 y.o. female.  With a history of ADHD, PTSD, depression who presents to the ED for evaluation of head trauma.  She states that 2 hours prior to arrival she was lifting a dresser down some stairs and one of the drawers fell out and hit her in the back of the head.  She did not lose consciousness after this.  Does not have retrograde amnesia.  Did not vomit, however did feel slightly nauseous.  Is not on blood thinners.  Did have a slight headache immediately afterwards, however this has resolved as well.  States she feels lightheaded, but denies dizziness.  Is able to ambulate without difficulty.  Denies vision changes.  Did not have seizure-like activity afterwards.   Head Injury      Home Medications Prior to Admission medications   Medication Sig Start Date End Date Taking? Authorizing Provider  amphetamine-dextroamphetamine (ADDERALL XR) 10 MG 24 hr capsule Take 1 capsule (10 mg total) by mouth daily. 08/26/22 08/26/23  Vista Mink, MD  propranolol (INDERAL) 10 MG tablet Take 1 tablet (10 mg total) by mouth 3 (three) times daily. 08/26/22   Vista Mink, MD      Allergies    Patient has no known allergies.    Review of Systems   Review of Systems  Neurological:  Positive for light-headedness.  All other systems reviewed and are negative.   Physical Exam Updated Vital Signs BP 122/70 (BP Location: Left Arm)   Pulse 80   Temp 98 F (36.7 C) (Oral)   Resp 15   SpO2 100%  Physical Exam Vitals and nursing note reviewed.  Constitutional:      General: She is not in acute distress.    Appearance: Normal appearance. She is normal weight. She is not ill-appearing.  HENT:     Head: Normocephalic and atraumatic.     Comments: No raccoon eyes or Battle  sign Eyes:     Extraocular Movements: Extraocular movements intact.     Pupils: Pupils are equal, round, and reactive to light.     Comments: No nystagmus  Cardiovascular:     Rate and Rhythm: Normal rate and regular rhythm.  Pulmonary:     Effort: Pulmonary effort is normal. No respiratory distress.  Abdominal:     General: Abdomen is flat.  Musculoskeletal:        General: Normal range of motion.     Cervical back: Normal range of motion and neck supple.  Skin:    General: Skin is warm and dry.     Capillary Refill: Capillary refill takes less than 2 seconds.  Neurological:     General: No focal deficit present.     Mental Status: She is alert and oriented to person, place, and time.     Comments: No facial asymmetry, slurred speech, pronator drift, unilateral or global weakness.  Heel-to-shin and finger-to-nose normal.  Sensation intact in all extremities.  Psychiatric:        Mood and Affect: Mood normal.        Behavior: Behavior normal.     ED Results / Procedures / Treatments   Labs (all labs ordered are listed, but only abnormal results are displayed) Labs Reviewed - No data to display  EKG  None  Radiology No results found.  Procedures Procedures    Medications Ordered in ED Medications - No data to display  ED Course/ Medical Decision Making/ A&P                           Medical Decision Making This patient presents to the ED for concern of head trauma, this involves an extensive number of treatment options, and is a complaint that carries with it a high risk of complications and morbidity.  The differential diagnosis includes concussion, intracranial abnormality including hemorrhage   Co morbidities that complicate the patient evaluation   ADHD, PTSD, and depression  Additional history obtained from: Nursing notes from this visit.  Afebrile, hemodynamically stable.  26 year old female presenting to the ED for evaluation of concussion-like  symptoms.  She did not lose consciousness.  Did not have seizures.  No retrograde amnesia.  Not on blood thinners.  Not a dangerous mechanism.  No vomiting.  [place] head CT rule negative.  Physical exam unremarkable including unremarkable neurologic exam.  I have low suspicion for intracranial abnormalities at this time and patient is likely suffering from concussion.  Gave patient information regarding concussion management.  Encourage patient to follow-up with her primary care provider in 1 week.  Gave patient strict return precautions.  Stable at discharge.  At this time there does not appear to be any evidence of an acute emergency medical condition and the patient appears stable for discharge with appropriate outpatient follow up. Diagnosis was discussed with patient who verbalizes understanding of care plan and is agreeable to discharge. I have discussed return precautions with patient who verbalizes understanding. Patient encouraged to follow-up with their PCP within 1 week. All questions answered.  Note: Portions of this report may have been transcribed using voice recognition software. Every effort was made to ensure accuracy; however, inadvertent computerized transcription errors may still be present.          Final Clinical Impression(s) / ED Diagnoses Final diagnoses:  Concussion without loss of consciousness, initial encounter    Rx / DC Orders ED Discharge Orders     None         Michelle Piper, PA-C 09/28/22 1737    Loetta Rough, MD 09/28/22 1800

## 2022-09-28 NOTE — ED Triage Notes (Addendum)
Patient said she was carrying a bookshelf down and the metal part of the bookshelf the back side of her head. Felt like she was going to vomit. No LOC. Feels a lot of head pressure.

## 2022-09-28 NOTE — Discharge Instructions (Signed)
You have been seen today for your complaint of head trauma. Home care instructions are as follows:  Follow the concussion management information listed in this packet Follow up with: Your primary care provider in 1 week Please seek immediate medical care if you develop any of the following symptoms: You have a severe or worsening headache. You have weakness or numbness in any part of your body, slurred speech, vision changes, or confusion. You vomit repeatedly. You lose consciousness, are sleepier than normal, or are difficult to wake up. You have a seizure. At this time there does not appear to be the presence of an emergent medical condition, however there is always the potential for conditions to change. Please read and follow the below instructions.  Do not take your medicine if  develop an itchy rash, swelling in your mouth or lips, or difficulty breathing; call 911 and seek immediate emergency medical attention if this occurs.  You may review your lab tests and imaging results in their entirety on your MyChart account.  Please discuss all results of fully with your primary care provider and other specialist at your follow-up visit.  Note: Portions of this text may have been transcribed using voice recognition software. Every effort was made to ensure accuracy; however, inadvertent computerized transcription errors may still be present.

## 2022-09-30 ENCOUNTER — Ambulatory Visit (HOSPITAL_BASED_OUTPATIENT_CLINIC_OR_DEPARTMENT_OTHER): Payer: Commercial Managed Care - HMO | Admitting: Psychiatry

## 2022-09-30 ENCOUNTER — Encounter (HOSPITAL_COMMUNITY): Payer: Self-pay | Admitting: Psychiatry

## 2022-09-30 ENCOUNTER — Telehealth (HOSPITAL_COMMUNITY): Payer: Self-pay | Admitting: *Deleted

## 2022-09-30 VITALS — BP 115/79 | HR 80 | Resp 18 | Ht 64.0 in | Wt 119.0 lb

## 2022-09-30 DIAGNOSIS — F4312 Post-traumatic stress disorder, chronic: Secondary | ICD-10-CM

## 2022-09-30 DIAGNOSIS — F3342 Major depressive disorder, recurrent, in full remission: Secondary | ICD-10-CM | POA: Diagnosis not present

## 2022-09-30 DIAGNOSIS — F909 Attention-deficit hyperactivity disorder, unspecified type: Secondary | ICD-10-CM

## 2022-09-30 MED ORDER — AMPHETAMINE-DEXTROAMPHET ER 10 MG PO CP24: 10.0000 mg | ORAL_CAPSULE | Freq: Every day | ORAL | 0 refills | Status: DC

## 2022-09-30 NOTE — Telephone Encounter (Signed)
Referrals for ADHD evaluation was sent to [facility] on 09/26/22.
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
## 2022-10-21 ENCOUNTER — Ambulatory Visit (HOSPITAL_COMMUNITY): Payer: Commercial Managed Care - HMO | Admitting: Psychiatry

## 2022-12-14 ENCOUNTER — Encounter (HOSPITAL_COMMUNITY): Payer: Self-pay | Admitting: Psychiatry

## 2022-12-14 ENCOUNTER — Ambulatory Visit (HOSPITAL_BASED_OUTPATIENT_CLINIC_OR_DEPARTMENT_OTHER): Payer: Commercial Managed Care - HMO | Admitting: Psychiatry

## 2022-12-14 VITALS — BP 103/67 | HR 65 | Temp 98.3°F | Ht 64.0 in | Wt 123.6 lb

## 2022-12-14 DIAGNOSIS — F3342 Major depressive disorder, recurrent, in full remission: Secondary | ICD-10-CM

## 2022-12-14 DIAGNOSIS — F4312 Post-traumatic stress disorder, chronic: Secondary | ICD-10-CM

## 2022-12-14 DIAGNOSIS — F909 Attention-deficit hyperactivity disorder, unspecified type: Secondary | ICD-10-CM | POA: Diagnosis not present

## 2022-12-14 MED ORDER — CYPROHEPTADINE HCL 4 MG PO TABS: 4.0000 mg | ORAL_TABLET | Freq: Every day | ORAL | 0 refills | Status: DC

## 2022-12-14 MED ORDER — ATOMOXETINE HCL 40 MG PO CAPS: 40.0000 mg | ORAL_CAPSULE | Freq: Every day | ORAL | 2 refills | Status: DC

## 2022-12-14 MED ORDER — PROPRANOLOL HCL 10 MG PO TABS: 10.0000 mg | ORAL_TABLET | Freq: Three times a day (TID) | ORAL | 1 refills | Status: DC
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
## 2023-01-27 ENCOUNTER — Ambulatory Visit (HOSPITAL_BASED_OUTPATIENT_CLINIC_OR_DEPARTMENT_OTHER): Payer: Commercial Managed Care - HMO | Admitting: Psychiatry

## 2023-01-27 ENCOUNTER — Encounter (HOSPITAL_COMMUNITY): Payer: Self-pay | Admitting: Psychiatry

## 2023-01-27 VITALS — BP 118/79 | HR 76 | Ht 64.0 in | Wt 136.0 lb

## 2023-01-27 DIAGNOSIS — Z30011 Encounter for initial prescription of contraceptive pills: Secondary | ICD-10-CM

## 2023-01-27 DIAGNOSIS — F4312 Post-traumatic stress disorder, chronic: Secondary | ICD-10-CM | POA: Diagnosis not present

## 2023-01-27 DIAGNOSIS — F909 Attention-deficit hyperactivity disorder, unspecified type: Secondary | ICD-10-CM | POA: Diagnosis not present

## 2023-01-27 DIAGNOSIS — F3342 Major depressive disorder, recurrent, in full remission: Secondary | ICD-10-CM | POA: Diagnosis not present

## 2023-01-27 MED ORDER — DROSPIRENONE-ETHINYL ESTRADIOL 3-0.02 MG PO TABS: 1.0000 | ORAL_TABLET | Freq: Every day | ORAL | 1 refills | Status: AC

## 2023-01-27 MED ORDER — ATOMOXETINE HCL 10 MG PO CAPS: 10.0000 mg | ORAL_CAPSULE | Freq: Every day | ORAL | 2 refills | Status: DC

## 2023-01-27 MED ORDER — ATOMOXETINE HCL 40 MG PO CAPS: 40.0000 mg | ORAL_CAPSULE | Freq: Every day | ORAL | 2 refills | Status: DC
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
## 2023-03-03 ENCOUNTER — Encounter (HOSPITAL_COMMUNITY): Payer: Commercial Managed Care - HMO | Admitting: Psychiatry

## 2023-03-03 NOTE — Progress Notes (Signed)
This encounter was created in error - please disregard.
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
## 2023-03-04 ENCOUNTER — Encounter (HOSPITAL_COMMUNITY): Payer: Self-pay | Admitting: Psychiatry

## 2023-03-04 ENCOUNTER — Telehealth (HOSPITAL_BASED_OUTPATIENT_CLINIC_OR_DEPARTMENT_OTHER): Payer: Commercial Managed Care - HMO | Admitting: Psychiatry

## 2023-03-04 DIAGNOSIS — F3342 Major depressive disorder, recurrent, in full remission: Secondary | ICD-10-CM | POA: Diagnosis not present

## 2023-03-04 DIAGNOSIS — F4312 Post-traumatic stress disorder, chronic: Secondary | ICD-10-CM

## 2023-03-04 DIAGNOSIS — F909 Attention-deficit hyperactivity disorder, unspecified type: Secondary | ICD-10-CM

## 2023-03-04 MED ORDER — ESCITALOPRAM OXALATE 10 MG PO TABS: 10.0000 mg | ORAL_TABLET | Freq: Every day | ORAL | 1 refills | Status: DC

## 2023-03-04 MED ORDER — ATOMOXETINE HCL 60 MG PO CAPS: 60.0000 mg | ORAL_CAPSULE | Freq: Every day | ORAL | 2 refills | Status: DC
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
## 2023-03-25 ENCOUNTER — Other Ambulatory Visit (HOSPITAL_COMMUNITY): Payer: Self-pay | Admitting: Psychiatry

## 2023-03-25 DIAGNOSIS — Z30011 Encounter for initial prescription of contraceptive pills: Secondary | ICD-10-CM

## 2023-04-05 ENCOUNTER — Encounter (HOSPITAL_COMMUNITY): Payer: Self-pay | Admitting: Psychiatry

## 2023-04-05 ENCOUNTER — Ambulatory Visit (HOSPITAL_BASED_OUTPATIENT_CLINIC_OR_DEPARTMENT_OTHER): Payer: Commercial Managed Care - HMO | Admitting: Psychiatry

## 2023-04-05 VITALS — BP 103/71 | HR 80 | Ht 63.0 in | Wt 130.0 lb

## 2023-04-05 DIAGNOSIS — F909 Attention-deficit hyperactivity disorder, unspecified type: Secondary | ICD-10-CM | POA: Diagnosis not present

## 2023-04-05 DIAGNOSIS — F3342 Major depressive disorder, recurrent, in full remission: Secondary | ICD-10-CM

## 2023-04-05 DIAGNOSIS — F4312 Post-traumatic stress disorder, chronic: Secondary | ICD-10-CM | POA: Diagnosis not present

## 2023-04-05 MED ORDER — ESCITALOPRAM OXALATE 10 MG PO TABS: 20.0000 mg | ORAL_TABLET | Freq: Every day | ORAL | 1 refills | Status: DC

## 2023-04-05 MED ORDER — PROPRANOLOL HCL 10 MG PO TABS: 10.0000 mg | ORAL_TABLET | Freq: Three times a day (TID) | ORAL | 1 refills | Status: DC
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
## 2023-04-30 ENCOUNTER — Other Ambulatory Visit (HOSPITAL_COMMUNITY): Payer: Self-pay | Admitting: Psychiatry

## 2023-04-30 DIAGNOSIS — F909 Attention-deficit hyperactivity disorder, unspecified type: Secondary | ICD-10-CM

## 2023-05-10 ENCOUNTER — Ambulatory Visit (HOSPITAL_BASED_OUTPATIENT_CLINIC_OR_DEPARTMENT_OTHER): Payer: Commercial Managed Care - HMO | Admitting: Psychiatry

## 2023-05-10 ENCOUNTER — Encounter (HOSPITAL_COMMUNITY): Payer: Self-pay | Admitting: Psychiatry

## 2023-05-10 VITALS — BP 117/64 | HR 86 | Ht 63.0 in | Wt 124.0 lb

## 2023-05-10 DIAGNOSIS — F909 Attention-deficit hyperactivity disorder, unspecified type: Secondary | ICD-10-CM | POA: Diagnosis not present

## 2023-05-10 DIAGNOSIS — F4312 Post-traumatic stress disorder, chronic: Secondary | ICD-10-CM | POA: Diagnosis not present

## 2023-05-10 DIAGNOSIS — F3342 Major depressive disorder, recurrent, in full remission: Secondary | ICD-10-CM

## 2023-05-10 MED ORDER — ESCITALOPRAM OXALATE 20 MG PO TABS: 20.0000 mg | ORAL_TABLET | Freq: Every day | ORAL | 1 refills | Status: DC

## 2023-05-10 MED ORDER — ATOMOXETINE HCL 60 MG PO CAPS: 60.0000 mg | ORAL_CAPSULE | Freq: Every day | ORAL | 2 refills | Status: DC
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
## 2023-06-22 ENCOUNTER — Ambulatory Visit (HOSPITAL_COMMUNITY): Payer: Commercial Managed Care - HMO | Admitting: Psychiatry

## 2023-06-22 ENCOUNTER — Encounter (HOSPITAL_COMMUNITY): Payer: Self-pay | Admitting: Psychiatry

## 2023-06-22 VITALS — BP 102/67 | HR 67 | Ht 63.0 in | Wt 127.0 lb

## 2023-06-22 DIAGNOSIS — F4312 Post-traumatic stress disorder, chronic: Secondary | ICD-10-CM | POA: Diagnosis not present

## 2023-06-22 DIAGNOSIS — F909 Attention-deficit hyperactivity disorder, unspecified type: Secondary | ICD-10-CM | POA: Diagnosis not present

## 2023-06-22 DIAGNOSIS — F3342 Major depressive disorder, recurrent, in full remission: Secondary | ICD-10-CM | POA: Diagnosis not present

## 2023-06-22 MED ORDER — ESCITALOPRAM OXALATE 20 MG PO TABS: 20.0000 mg | ORAL_TABLET | Freq: Every day | ORAL | 1 refills | Status: DC

## 2023-06-22 MED ORDER — BUPROPION HCL ER (XL) 150 MG PO TB24: 150.0000 mg | ORAL_TABLET | ORAL | 2 refills | Status: DC

## 2023-06-22 MED ORDER — PROPRANOLOL HCL 10 MG PO TABS
10.0000 mg | ORAL_TABLET | Freq: Two times a day (BID) | ORAL | 1 refills | Status: DC
Start: 2023-06-22 — End: 2024-01-06

## 2023-06-30 ENCOUNTER — Encounter (HOSPITAL_COMMUNITY): Payer: Self-pay | Admitting: Licensed Clinical Social Worker

## 2023-06-30 ENCOUNTER — Ambulatory Visit (INDEPENDENT_AMBULATORY_CARE_PROVIDER_SITE_OTHER): Payer: Commercial Managed Care - HMO | Admitting: Licensed Clinical Social Worker

## 2023-06-30 ENCOUNTER — Encounter (HOSPITAL_COMMUNITY): Payer: Self-pay

## 2023-06-30 DIAGNOSIS — F909 Attention-deficit hyperactivity disorder, unspecified type: Secondary | ICD-10-CM | POA: Diagnosis not present

## 2023-06-30 DIAGNOSIS — F4312 Post-traumatic stress disorder, chronic: Secondary | ICD-10-CM | POA: Diagnosis not present

## 2023-06-30 DIAGNOSIS — F3342 Major depressive disorder, recurrent, in full remission: Secondary | ICD-10-CM | POA: Diagnosis not present

## 2023-06-30 DIAGNOSIS — F331 Major depressive disorder, recurrent, moderate: Secondary | ICD-10-CM
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
## 2023-07-14 ENCOUNTER — Ambulatory Visit (INDEPENDENT_AMBULATORY_CARE_PROVIDER_SITE_OTHER): Payer: Commercial Managed Care - HMO | Admitting: Licensed Clinical Social Worker

## 2023-07-14 DIAGNOSIS — F331 Major depressive disorder, recurrent, moderate: Secondary | ICD-10-CM | POA: Diagnosis not present

## 2023-07-14 DIAGNOSIS — F4312 Post-traumatic stress disorder, chronic: Secondary | ICD-10-CM | POA: Diagnosis not present

## 2023-07-16 ENCOUNTER — Encounter (HOSPITAL_COMMUNITY): Payer: Self-pay | Admitting: Licensed Clinical Social Worker

## 2023-07-16 NOTE — Progress Notes (Signed)
Virtual Visit via Video Note  I connected with Alexandria [name] on 07/16/23 at  1:30 PM EDT by a video enabled telemedicine application and verified that I am speaking with the correct person using two identifiers.  Location: Patient: home Provider: home office   I discussed the limitations of evaluation and management by telemedicine and the availability of in person appointments. The patient expressed understanding and agreed to proceed.   I discussed the assessment and treatment plan with the patient. The patient was provided an opportunity to ask questions and all were answered. The patient agreed with the plan and demonstrated an understanding of the instructions.   The patient was advised to call back or seek an in-person evaluation if the symptoms worsen or if the condition fails to improve as anticipated.  I provided 55 minutes of non-face-to-face time during this encounter.   Alexandria Melter, LCSW   THERAPIST PROGRESS NOTE  Session Time: 1:30pm-2:25pm  Participation Level: Active  Behavioral Response: CasualAlertEuthymic  Type of Therapy: Individual Therapy  Treatment Goals addressed:  Problem: OP Depression     Dates: Start:  06/30/23       Disciplines: Interdisciplinary, PROVIDER        Goal: LTG: Reduce frequency, intensity, and duration of depression symptoms so that daily functioning is improved     Dates: Start:  06/30/23    Expected End:  12/31/23       Disciplines: Interdisciplinary, PROVIDER             Goal: LTG: Increase coping skills to manage depression and improve ability to perform daily activities     Dates: Start:  06/30/23    Expected End:  12/31/23       Disciplines: Interdisciplinary, PROVIDER             Goal: STG: Alexandria [name]" will identify cognitive patterns and beliefs that support depression     Dates: Start:  06/30/23    Expected End:  12/31/23       Disciplines: Interdisciplinary, PROVIDER             Intervention: Work  with Alexandria [name]" to track symptoms, triggers, and/or skill use through a mood chart, diary card, or journal     Dates: Start:  06/30/23                Intervention: Therapist will educate patient on cognitive distortions and the rationale for treatment of depression     Dates: Start:  06/30/23                Intervention: Create a weekly activity schedule     Dates: Start:  06/30/23                    Template: PTSD (OP)         Problem: BH CCP Acute or Chronic Trauma Reaction     Dates: Start:  06/30/23       Disciplines: Interdisciplinary, PROVIDER        Goal: LTG: Recall traumatic events without becoming overwhelmed with negative emotions     Dates: Start:  06/30/23    Expected End:  12/31/23       Disciplines: Interdisciplinary, PROVIDER             Goal: STG: Alexandria [name]" will verbalize an increased sense of mastery over PTSD symptoms by using several techniques to cope with flashbacks, decrease the power of triggers, and decrease negative thinking  Dates: Start:  06/30/23    Expected End:  12/31/23       Disciplines: Interdisciplinary, PROVIDER             Goal: STG: Alexandria [name]" will cooperate with a medication evaluation by accurately reporting symptoms, if present     Dates: Start:  06/30/23    Expected End:  12/31/23       Disciplines: Interdisciplinary, PROVIDER             Goal: STG: Alexandria [name]" will cooperate with treatment in an effort to reduce PHQ-9 assessment scores     Dates: Start:  06/30/23    Expected End:  12/31/23       Disciplines: Interdisciplinary, PROVIDER           ProgressTowards Goals: Progressing  Interventions: Motivational Interviewing  Summary: Alexandria [name] is a 27 y.o. female who presents with Chronic PTSD and MDD, recurrent, moderate.   Suicidal/Homicidal: Nowithout intent/plan  Therapist Response: Alexandria [name] engaged well in individual virtual session with Facilities manager. Clinician utilized MI OARS to reflect and  summarize thoughts and feelings. Clinician built rapport by discussing current mood and trauma sxs. Clinician provided time and space for Alexandria [name] to identify the challenges and stressors they experience. Clinician built rapport, processed interactions with partner, and identified triggers.   Plan: Return again in 2-3 weeks.  Diagnosis: Chronic post-traumatic stress disorder (PTSD)  MDD (major depressive disorder), recurrent episode, moderate (HCC)  Collaboration of Care: Psychiatrist AEB provided updates to Dr. Mercy [name]  Patient/Guardian was advised Release of Information must be obtained prior to any record release in order to collaborate their care with an outside provider. Patient/Guardian was advised if they have not already done so to contact the registration department to sign all necessary forms in order for [place] to release information regarding their care.   Consent: Patient/Guardian gives verbal consent for treatment and assignment of benefits for services provided during this visit. Patient/Guardian expressed understanding and agreed to proceed.   Alexandria Heck [place], LCSW 07/16/2023

## 2023-08-07 ENCOUNTER — Other Ambulatory Visit (HOSPITAL_COMMUNITY): Payer: Self-pay | Admitting: Psychiatry

## 2023-08-07 DIAGNOSIS — F4312 Post-traumatic stress disorder, chronic: Secondary | ICD-10-CM

## 2023-08-07 DIAGNOSIS — F3342 Major depressive disorder, recurrent, in full remission: Secondary | ICD-10-CM

## 2023-08-12 ENCOUNTER — Encounter (HOSPITAL_COMMUNITY): Payer: Self-pay
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
## 2023-08-17 ENCOUNTER — Ambulatory Visit (HOSPITAL_COMMUNITY): Payer: Commercial Managed Care - HMO | Admitting: Licensed Clinical Social Worker

## 2023-08-17 ENCOUNTER — Ambulatory Visit (HOSPITAL_BASED_OUTPATIENT_CLINIC_OR_DEPARTMENT_OTHER): Payer: Commercial Managed Care - HMO | Admitting: Psychiatry

## 2023-08-17 VITALS — BP 115/69 | HR 78 | Ht 63.0 in | Wt 130.0 lb

## 2023-08-17 DIAGNOSIS — F909 Attention-deficit hyperactivity disorder, unspecified type: Secondary | ICD-10-CM

## 2023-08-17 DIAGNOSIS — F4312 Post-traumatic stress disorder, chronic: Secondary | ICD-10-CM | POA: Diagnosis not present

## 2023-08-17 DIAGNOSIS — F331 Major depressive disorder, recurrent, moderate: Secondary | ICD-10-CM | POA: Diagnosis not present

## 2023-08-17 MED ORDER — VENLAFAXINE HCL ER 37.5 MG PO CP24
ORAL_CAPSULE | ORAL | 2 refills | Status: DC
Start: 2023-08-17 — End: 2023-09-07

## 2023-08-18 ENCOUNTER — Encounter (HOSPITAL_COMMUNITY): Payer: Self-pay | Admitting: Psychiatry

## 2023-08-24 ENCOUNTER — Ambulatory Visit (HOSPITAL_COMMUNITY): Payer: Commercial Managed Care - HMO | Admitting: Licensed Clinical Social Worker

## 2023-08-24 ENCOUNTER — Encounter (HOSPITAL_COMMUNITY): Payer: Self-pay | Admitting: Licensed Clinical Social Worker

## 2023-08-24 DIAGNOSIS — F4312 Post-traumatic stress disorder, chronic: Secondary | ICD-10-CM | POA: Diagnosis not present

## 2023-08-24 NOTE — Progress Notes (Signed)
   THERAPIST PROGRESS NOTE  Session Time: 11:00am-11:55am  Participation Level: Active  Behavioral Response: Well GroomedAlertconfused, insecure  Type of Therapy: Individual Therapy  Treatment Goals addressed:  Problem: OP Depression     Dates: Start:  06/30/23       Disciplines: Interdisciplinary, PROVIDER           Goal: LTG: Reduce frequency, intensity, and duration of depression symptoms so that daily functioning is improved     Dates: Start:  06/30/23    Expected End:  12/31/23       Disciplines: Interdisciplinary, PROVIDER                Goal: LTG: Increase coping skills to manage depression and improve ability to perform daily activities     Dates: Start:  06/30/23    Expected End:  12/31/23       Disciplines: Interdisciplinary, PROVIDER                Goal: STG: [name]" will identify cognitive patterns and beliefs that support depression     Dates: Start:  06/30/23    Expected End:  12/31/23       Disciplines: Interdisciplinary, PROVIDER                Intervention: Work with [name]" to track symptoms, triggers, and/or skill use through a mood chart, diary card, or journal     Dates: Start:  06/30/23                   Intervention: Therapist will educate patient on cognitive distortions and the rationale for treatment of depression     Dates: Start:  06/30/23                   Intervention: Create a weekly activity schedule     Dates: Start:  06/30/23            ProgressTowards Goals: Progressing  Interventions: Motivational Interviewing  Summary: Jaylin Kuzminski is a 27 y.o. female who presents with Chronic PTSD.   Suicidal/Homicidal: Nowithout intent/plan  Therapist Response: [name] engaged well in individual session with clinician. Clinician utilized MI OARS to continue with rapport building and assessment of ASD vs BPD. Clinician explored sxs and noted that BPD sxs are more likely to occur with other people who have BPD. Clinician  processed beliefs about ASD and noted the possibility that ASD would make socialization and interactions impaired. [name] shared concerns about current relationship. Clinician provided support and encouraged possible couples counseling in the future.   Plan: Return again in 1-2 weeks.  Diagnosis: Chronic post-traumatic stress disorder (PTSD)  Collaboration of Care: Patient refused AEB none required  Patient/Guardian was advised Release of Information must be obtained prior to any record release in order to collaborate their care with an outside provider. Patient/Guardian was advised if they have not already done so to contact the registration department to sign all necessary forms in order for [place] to release information regarding their care.   Consent: Patient/Guardian gives verbal consent for treatment and assignment of benefits for services provided during this visit. Patient/Guardian expressed understanding and agreed to proceed.   Chryl Heck Woodruff, LCSW 08/24/2023

## 2023-08-31 ENCOUNTER — Ambulatory Visit (INDEPENDENT_AMBULATORY_CARE_PROVIDER_SITE_OTHER): Payer: Managed Care, Other (non HMO) | Admitting: Licensed Clinical Social Worker

## 2023-08-31 DIAGNOSIS — F4312 Post-traumatic stress disorder, chronic: Secondary | ICD-10-CM

## 2023-09-01 ENCOUNTER — Ambulatory Visit (HOSPITAL_COMMUNITY): Payer: Commercial Managed Care - HMO | Admitting: Licensed Clinical Social Worker

## 2023-09-01 ENCOUNTER — Encounter (HOSPITAL_COMMUNITY): Payer: Self-pay | Admitting: Licensed Clinical Social Worker

## 2023-09-01 NOTE — Progress Notes (Signed)
   THERAPIST PROGRESS NOTE  Session Time: 11:00am-11:55am  Participation Level: Active  Behavioral Response: NeatAlertEuthymic  Type of Therapy: Individual Therapy  Treatment Goals addressed:  Goal: LTG: Reduce frequency, intensity, and duration of depression symptoms so that daily functioning is improved     Dates: Start:  06/30/23    Expected End:  12/31/23       Disciplines: Interdisciplinary, PROVIDER                 Goal: LTG: Increase coping skills to manage depression and improve ability to perform daily activities     Dates: Start:  06/30/23    Expected End:  12/31/23       Disciplines: Interdisciplinary, PROVIDER                Goal: STG: [name]" will identify cognitive patterns and beliefs that support depression     Dates: Start:  06/30/23    Expected End:  12/31/23       Disciplines: Interdisciplinary, PROVIDER                Intervention: Work with [name]" to track symptoms, triggers, and/or skill use through a mood chart, diary card, or journal     Dates: Start:  06/30/23                   Intervention: Therapist will educate patient on cognitive distortions and the rationale for treatment of depression     Dates: Start:  06/30/23                   Intervention: Create a weekly activity schedule     Dates: Start:  06/30/23            ProgressTowards Goals: Progressing  Interventions: Motivational Interviewing  Summary: Gracynn Wonderly is a 27 y.o. female who presents with Chronic PTSD.   Suicidal/Homicidal: Nowithout intent/plan  Therapist Response: [name] engaged well in individual in person session with clinician. Clinician utilized MI OARS to reflect and summarize thoughts, feelings, and interactions. Clinician explored interactions and relationship with partner and identified some ongoing resentment and trauma from the past relationships. Clinician explored the impact of past relationship trauma on this relationship, especially  since [name] and partner have been together in the past. Clinician reflected the challenges of different types of relationships, as well as challenges in having different relationships with the same people. Clinician encouraged [name] to consider their own feelings of safety and security in the relationship, as well as how the past has impacted their ability to trust partner.    Plan: Return again in 2 weeks.  Diagnosis: Chronic post-traumatic stress disorder (PTSD)  Collaboration of Care: Patient refused AEB none required  Patient/Guardian was advised Release of Information must be obtained prior to any record release in order to collaborate their care with an outside provider. Patient/Guardian was advised if they have not already done so to contact the registration department to sign all necessary forms in order for [place] to release information regarding their care.   Consent: Patient/Guardian gives verbal consent for treatment and assignment of benefits for services provided during this visit. Patient/Guardian expressed understanding and agreed to proceed.   Chryl Heck Narcissa, LCSW 09/01/2023
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
## 2023-09-07 ENCOUNTER — Ambulatory Visit (HOSPITAL_BASED_OUTPATIENT_CLINIC_OR_DEPARTMENT_OTHER): Payer: Commercial Managed Care - HMO | Admitting: Psychiatry

## 2023-09-07 ENCOUNTER — Encounter (HOSPITAL_COMMUNITY): Payer: Self-pay | Admitting: Psychiatry

## 2023-09-07 ENCOUNTER — Ambulatory Visit (HOSPITAL_COMMUNITY): Payer: Commercial Managed Care - HMO | Admitting: Licensed Clinical Social Worker

## 2023-09-07 ENCOUNTER — Other Ambulatory Visit: Payer: Self-pay

## 2023-09-07 DIAGNOSIS — F331 Major depressive disorder, recurrent, moderate: Secondary | ICD-10-CM | POA: Diagnosis not present

## 2023-09-07 DIAGNOSIS — F4312 Post-traumatic stress disorder, chronic: Secondary | ICD-10-CM | POA: Diagnosis not present

## 2023-09-07 MED ORDER — VENLAFAXINE HCL ER 75 MG PO CP24
75.0000 mg | ORAL_CAPSULE | Freq: Every day | ORAL | 2 refills | Status: DC
Start: 2023-09-07 — End: 2023-12-02

## 2023-09-14 ENCOUNTER — Ambulatory Visit (INDEPENDENT_AMBULATORY_CARE_PROVIDER_SITE_OTHER): Payer: Commercial Managed Care - HMO | Admitting: Licensed Clinical Social Worker

## 2023-09-14 ENCOUNTER — Encounter (HOSPITAL_COMMUNITY): Payer: Self-pay | Admitting: Licensed Clinical Social Worker

## 2023-09-14 DIAGNOSIS — F4312 Post-traumatic stress disorder, chronic: Secondary | ICD-10-CM

## 2023-09-14 NOTE — Progress Notes (Signed)
   THERAPIST PROGRESS NOTE  Session Time: 2:30pm-3:30pm  Participation Level: Active  Behavioral Response: NeatAlertDysphoric and Irritable  Type of Therapy: Individual Therapy  Treatment Goals addressed:  Goal: LTG: Reduce frequency, intensity, and duration of depression symptoms so that daily functioning is improved     Dates: Start:  06/30/23    Expected End:  12/31/23       Disciplines: Interdisciplinary, PROVIDER                  Goal: LTG: Increase coping skills to manage depression and improve ability to perform daily activities     Dates: Start:  06/30/23    Expected End:  12/31/23       Disciplines: Interdisciplinary, PROVIDER                Goal: STG: [name]" will identify cognitive patterns and beliefs that support depression     Dates: Start:  06/30/23    Expected End:  12/31/23       Disciplines: Interdisciplinary, PROVIDER                Intervention: Work with [name]" to track symptoms, triggers, and/or skill use through a mood chart, diary card, or journal     Dates: Start:  06/30/23                   Intervention: Therapist will educate patient on cognitive distortions and the rationale for treatment of depression     Dates: Start:  06/30/23                   Intervention: Create a weekly activity schedule     Dates: Start:  06/30/23            ProgressTowards Goals: Progressing  Interventions: CBT  Summary: Alexandria [name] is a 27 y.o. female who presents with Chronic PTSD.   Suicidal/Homicidal: Nowithout intent/plan  Therapist Response: Baxter Hire engaged well in person for individual session with clinician.  Clinician utilized CBT to process thoughts feelings and behaviors.  Clinician explored interactions with partner and friends.  Clinician identified concerns regarding friendships and relationship status.  Cionna reports feelings of discomfort and dissatisfaction in current relationship.  Baxter Hire is identifying more self-esteem  and self compassion.  Baxter Hire reports stable mood with the exception of an outburst last week due to relationship problems.  Clinician discussed coping skills and encouraged them to focus on schoolwork and house rather than worrying about the status of their relationship at this time.  Baxter Hire and clinician agreed that during winter break will be a better time to address relationship issues.  Plan: Return again in 1-2 weeks.  Diagnosis: Chronic post-traumatic stress disorder (PTSD)  Collaboration of Care: Medication Management AEB reviewed notes from Dr. Mercy Riding  Patient/Guardian was advised Release of Information must be obtained prior to any record release in order to collaborate their care with an outside provider. Patient/Guardian was advised if they have not already done so to contact the registration department to sign all necessary forms in order for [place] to release information regarding their care.   Consent: Patient/Guardian gives verbal consent for treatment and assignment of benefits for services provided during this visit. Patient/Guardian expressed understanding and agreed to proceed.   Chryl Heck [place], LCSW 09/14/2023

## 2023-09-28 ENCOUNTER — Ambulatory Visit (INDEPENDENT_AMBULATORY_CARE_PROVIDER_SITE_OTHER): Payer: Managed Care, Other (non HMO) | Admitting: Licensed Clinical Social Worker

## 2023-09-28 DIAGNOSIS — F4312 Post-traumatic stress disorder, chronic: Secondary | ICD-10-CM

## 2023-09-29 ENCOUNTER — Encounter (HOSPITAL_COMMUNITY): Payer: Self-pay | Admitting: Licensed Clinical Social Worker

## 2023-09-29 NOTE — Progress Notes (Signed)
   THERAPIST PROGRESS NOTE  Session Time: 2:40-3:30pm  Participation Level: Active  Behavioral Response: NeatAlertEuthymic  Type of Therapy: Individual Therapy  Treatment Goals addressed:      Goal: LTG: Reduce frequency, intensity, and duration of depression symptoms so that daily functioning is improved     Dates: Start:  06/30/23    Expected End:  12/31/23       Disciplines: Interdisciplinary, PROVIDER                  Goal: LTG: Increase coping skills to manage depression and improve ability to perform daily activities     Dates: Start:  06/30/23    Expected End:  12/31/23       Disciplines: Interdisciplinary, PROVIDER                Goal: STG: [name]" will identify cognitive patterns and beliefs that support depression     Dates: Start:  06/30/23    Expected End:  12/31/23       Disciplines: Interdisciplinary, PROVIDER                Intervention: Work with [name]" to track symptoms, triggers, and/or skill use through a mood chart, diary card, or journal     Dates: Start:  06/30/23                   Intervention: Therapist will educate patient on cognitive distortions and the rationale for treatment of depression     Dates: Start:  06/30/23                   Intervention: Create a weekly activity schedule     Dates: Start:  06/30/23            ProgressTowards Goals: Progressing  Interventions: CBT  Summary: Alexandria [name] is a 27 y.o. female who presents with C-PTSD.   Suicidal/Homicidal: Nowithout intent/plan  Therapist Response: Alexandria [name] engaged well in individual session with clinician.  Clinician utilized CBT to process thoughts feelings and interactions.  Alexandria [name] shared updates about relationship, and noted that their partner had broken up with them.  Clinician processed the break-up, aftermath, and current status of [name]'s emotional state and her ability to focus on the rest of their life.  Clinician identified positives that have come out  of this experience and noted the biggest struggle was losing the friendship with their ex-partner's new partner.  Clinician discussed coping skills and [name]'s ability to reach out to other supports in their life.  Alexandria [name] shared that their other friends have been very supportive and loving to them.  Plan: Return again in 1-2 weeks.  Diagnosis: Chronic post-traumatic stress disorder (PTSD)  Collaboration of Care: Psychiatrist AEB provided update to Dr. Mercy Riding  Patient/Guardian was advised Release of Information must be obtained prior to any record release in order to collaborate their care with an outside provider. Patient/Guardian was advised if they have not already done so to contact the registration department to sign all necessary forms in order for [place] to release information regarding their care.   Consent: Patient/Guardian gives verbal consent for treatment and assignment of benefits for services provided during this visit. Patient/Guardian expressed understanding and agreed to proceed.   Chryl Heck [place], LCSW 09/29/2023

## 2023-10-06 ENCOUNTER — Ambulatory Visit (HOSPITAL_COMMUNITY): Payer: Commercial Managed Care - HMO | Admitting: Licensed Clinical Social Worker

## 2023-10-06 ENCOUNTER — Encounter (HOSPITAL_COMMUNITY): Payer: Self-pay | Admitting: Licensed Clinical Social Worker

## 2023-10-06 DIAGNOSIS — F4312 Post-traumatic stress disorder, chronic: Secondary | ICD-10-CM

## 2023-10-06 NOTE — Progress Notes (Signed)
Virtual Visit via Video Note  I connected with Alexandria [name] on 10/06/23 at  2:30 PM EST by a video enabled telemedicine application and verified that I am speaking with the correct person using two identifiers.  Location: Patient: home Provider: home office   I discussed the limitations of evaluation and management by telemedicine and the availability of in person appointments. The patient expressed understanding and agreed to proceed.   I discussed the assessment and treatment plan with the patient. The patient was provided an opportunity to ask questions and all were answered. The patient agreed with the plan and demonstrated an understanding of the instructions.   The patient was advised to call back or seek an in-person evaluation if the symptoms worsen or if the condition fails to improve as anticipated.  I provided 55 minutes of non-face-to-face time during this encounter.   [name], LCSW   THERAPIST PROGRESS NOTE  Session Time: 2:30pm-3:25pm  Participation Level: Active  Behavioral Response: NeatAlertIrritable  Type of Therapy: Individual Therapy  Treatment Goals addressed:  Goal: LTG: Reduce frequency, intensity, and duration of depression symptoms so that daily functioning is improved     Dates: Start:  06/30/23    Expected End:  12/31/23       Disciplines: Interdisciplinary, PROVIDER                  Goal: LTG: Increase coping skills to manage depression and improve ability to perform daily activities     Dates: Start:  06/30/23    Expected End:  12/31/23       Disciplines: Interdisciplinary, PROVIDER                Goal: STG: [name]" will identify cognitive patterns and beliefs that support depression     Dates: Start:  06/30/23    Expected End:  12/31/23       Disciplines: Interdisciplinary, PROVIDER                Intervention: Work with [name]" to track symptoms, triggers, and/or skill use through a mood chart, diary card,  or journal     Dates: Start:  06/30/23                   Intervention: Therapist will educate patient on cognitive distortions and the rationale for treatment of depression     Dates: Start:  06/30/23                   Intervention: Create a weekly activity schedule     Dates: Start:  06/30/23            ProgressTowards Goals: Progressing  Interventions: CBT  Summary: Alexandria [name] is a 27 y.o. female who presents with C-PTSD.   Suicidal/Homicidal: Nowithout intent/plan  Therapist Response: Alexandria [name] engaged well in Armed forces logistics/support/administrative officer with Facilities manager.  Clinician utilized CBT to process thoughts feelings and interactions.  Clinician explored overall mood and activities since last session.  Alexandria [name] shared that up until 3 days ago they have been doing very well with their mood and things have been very stable.  However, Alexandria [name] shared that the last 3 days they have been very irritable, easily agitated, and uncertain of the reasons why.  Clinician processed relationship with ex-partner and similarities between this relationship and previous relationships that were traumatic.  Alexandria [name] shared more hurt over losing a friend then losing the relationship with their partner.  Clinician explored how the holidays have impacted  mood and noted that December has been a historically bad month and Alexandria [name]'s family, largely due to father's behavior at this time of year.  Clinician processed this and noted that this might be the reason why Alexandria [name] has been more irritable over the past few days.  Alexandria [name] shared enlightenment and excitement to know that this may be the reason, because otherwise things have been going relatively smoothly.  Plan: Return again in 1 weeks.  Diagnosis: Chronic post-traumatic stress disorder (PTSD)  Collaboration of Care: Patient refused AEB none required  Patient/Guardian was advised Release of Information must be obtained prior to any record release in order to collaborate their care  with an outside provider. Patient/Guardian was advised if they have not already done so to contact the registration department to sign all necessary forms in order for [place] to release information regarding their care.   Consent: Patient/Guardian gives verbal consent for treatment and assignment of benefits for services provided during this visit. Patient/Guardian expressed understanding and agreed to proceed.   Chryl Heck [place], LCSW 10/06/2023

## 2023-10-12 ENCOUNTER — Ambulatory Visit (INDEPENDENT_AMBULATORY_CARE_PROVIDER_SITE_OTHER): Payer: Commercial Managed Care - HMO | Admitting: Licensed Clinical Social Worker

## 2023-10-12 ENCOUNTER — Ambulatory Visit (HOSPITAL_COMMUNITY): Payer: Managed Care, Other (non HMO) | Admitting: Psychiatry

## 2023-10-12 DIAGNOSIS — F4312 Post-traumatic stress disorder, chronic: Secondary | ICD-10-CM | POA: Diagnosis not present

## 2023-10-14 ENCOUNTER — Encounter (HOSPITAL_COMMUNITY): Payer: Self-pay | Admitting: Licensed Clinical Social Worker
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
## 2023-10-19 ENCOUNTER — Telehealth (HOSPITAL_COMMUNITY): Payer: Commercial Managed Care - HMO | Admitting: Psychiatry

## 2023-10-19 DIAGNOSIS — F4312 Post-traumatic stress disorder, chronic: Secondary | ICD-10-CM | POA: Diagnosis not present

## 2023-10-19 DIAGNOSIS — F3342 Major depressive disorder, recurrent, in full remission: Secondary | ICD-10-CM

## 2023-10-19 MED ORDER — BUPROPION HCL ER (XL) 150 MG PO TB24
150.0000 mg | ORAL_TABLET | Freq: Every day | ORAL | 0 refills | Status: DC
Start: 2023-10-19 — End: 2023-12-02

## 2023-10-20 ENCOUNTER — Ambulatory Visit (INDEPENDENT_AMBULATORY_CARE_PROVIDER_SITE_OTHER): Payer: Commercial Managed Care - HMO | Admitting: Licensed Clinical Social Worker

## 2023-10-20 DIAGNOSIS — F4312 Post-traumatic stress disorder, chronic: Secondary | ICD-10-CM | POA: Diagnosis not present

## 2023-10-24 NOTE — Progress Notes (Unsigned)
   THERAPIST PROGRESS NOTE  Session Time: ***  Participation Level: {BHH PARTICIPATION LEVEL:22264}  Behavioral Response: {Appearance:22683}{BHH LEVEL OF CONSCIOUSNESS:22305}{BHH MOOD:22306}  Type of Therapy: {CHL AMB BH Type of Therapy:21022741}  Treatment Goals addressed: ***  ProgressTowards Goals: {Progress Towards Goals:21014066}  Interventions: {CHL AMB BH Type of Intervention:21022753}  Summary: Alexandria [name] is a 27 y.o. female who presents with ***.   Suicidal/Homicidal: {BHH YES OR NO:22294}{yes/no/with/without intent/plan:22693}  Therapist Response: ***  Plan: Return again in *** weeks.  Diagnosis: No diagnosis found.  Collaboration of Care: {BH OP Collaboration of Care:21014065}  Patient/Guardian was advised Release of Information must be obtained prior to any record release in order to collaborate their care with an outside provider. Patient/Guardian was advised if they have not already done so to contact the registration department to sign all necessary forms in order for [place] to release information regarding their care.   Consent: Patient/Guardian gives verbal consent for treatment and assignment of benefits for services provided during this visit. Patient/Guardian expressed understanding and agreed to proceed.   Chryl Heck [place], LCSW 10/24/2023

## 2023-10-26 ENCOUNTER — Encounter (HOSPITAL_COMMUNITY): Payer: Self-pay | Admitting: Licensed Clinical Social Worker

## 2023-10-28 ENCOUNTER — Ambulatory Visit (INDEPENDENT_AMBULATORY_CARE_PROVIDER_SITE_OTHER): Payer: Commercial Managed Care - HMO | Admitting: Licensed Clinical Social Worker

## 2023-10-28 DIAGNOSIS — F4312 Post-traumatic stress disorder, chronic: Secondary | ICD-10-CM | POA: Diagnosis not present

## 2023-10-29 ENCOUNTER — Encounter (HOSPITAL_COMMUNITY): Payer: Self-pay | Admitting: Licensed Clinical Social Worker
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
## 2023-11-02 ENCOUNTER — Ambulatory Visit (HOSPITAL_COMMUNITY): Payer: Commercial Managed Care - HMO | Admitting: Licensed Clinical Social Worker

## 2023-11-09 ENCOUNTER — Encounter (HOSPITAL_COMMUNITY): Payer: Self-pay | Admitting: Licensed Clinical Social Worker

## 2023-11-09 ENCOUNTER — Ambulatory Visit (INDEPENDENT_AMBULATORY_CARE_PROVIDER_SITE_OTHER): Payer: Self-pay | Admitting: Licensed Clinical Social Worker

## 2023-11-09 DIAGNOSIS — F331 Major depressive disorder, recurrent, moderate: Secondary | ICD-10-CM

## 2023-11-09 DIAGNOSIS — F4312 Post-traumatic stress disorder, chronic: Secondary | ICD-10-CM

## 2023-11-09 NOTE — Progress Notes (Signed)
   THERAPIST PROGRESS NOTE  Session Time: 3:30-4:30pm  Participation Level: Active  Behavioral Response: NeatAlertDepressed  Type of Therapy: Individual Therapy  Treatment Goals addressed:  Goal: LTG: Reduce frequency, intensity, and duration of depression symptoms so that daily functioning is improved     Dates: Start:  06/30/23    Expected End:  12/31/23       Disciplines: Interdisciplinary, PROVIDER                  Goal: LTG: Increase coping skills to manage depression and improve ability to perform daily activities     Dates: Start:  06/30/23    Expected End:  12/31/23       Disciplines: Interdisciplinary, PROVIDER         ProgressTowards Goals: Progressing  Interventions: CBT  Summary: Alexandria [name] is a 28 y.o. female who presents with Chronic PTSD, MDD, recurrent, moderate.   Suicidal/Homicidal: Nowithout intent/plan  Therapist Response: Alexandria [name] engaged well in individual in person session with clinician. Clinician utilized CBT to process thoughts, feelings, and interactions. Alexandria [name] shared increased depressed mood, over-sleeping up to 15-16 hours per day, ruminating thoughts, and reduction in motivation to complete daily tasks. Clinician processed current events in life, coping with break-up, and questioning her relationships with friends and family. Clinician identified many concerns about how others view Alexandria [name], as well as fears that they will not find a solid long-term partnership. Clinician reflected high hopes of getting married, living together, and having married people adventures. Clinician noted that there is still time and encouraged Alexandria [name] to use this time to reflect and build themself up in order to be ready when the next relationship comes into place.  Alexandria [name] shared that they increased Effexor  just before the new year, which also coincides with increased depression. Clinician encouraged Alexandria [name] to review her journal and find out the actual dates and possible timeline  for events and med change to see if there is a connection.   Plan: Return again in 1 weeks.  Diagnosis: Chronic post-traumatic stress disorder (PTSD)  MDD (major depressive disorder), recurrent episode, moderate (HCC)  Collaboration of Care: Psychiatrist AEB reported concerns to Dr. Carvin  Patient/Guardian was advised Release of Information must be obtained prior to any record release in order to collaborate their care with an outside provider. Patient/Guardian was advised if they have not already done so to contact the registration department to sign all necessary forms in order for us  to release information regarding their care.   Consent: Patient/Guardian gives verbal consent for treatment and assignment of benefits for services provided during this visit. Patient/Guardian expressed understanding and agreed to proceed.   Alexandria SAUNDERS [place], LCSW 11/09/2023

## 2023-11-16 ENCOUNTER — Encounter (HOSPITAL_COMMUNITY): Payer: Self-pay | Admitting: Licensed Clinical Social Worker

## 2023-11-16 ENCOUNTER — Ambulatory Visit (INDEPENDENT_AMBULATORY_CARE_PROVIDER_SITE_OTHER): Payer: Self-pay | Admitting: Licensed Clinical Social Worker

## 2023-11-16 ENCOUNTER — Ambulatory Visit (HOSPITAL_COMMUNITY): Payer: Commercial Managed Care - HMO | Admitting: Licensed Clinical Social Worker

## 2023-11-16 DIAGNOSIS — F4312 Post-traumatic stress disorder, chronic: Secondary | ICD-10-CM

## 2023-11-16 DIAGNOSIS — F331 Major depressive disorder, recurrent, moderate: Secondary | ICD-10-CM

## 2023-11-16 DIAGNOSIS — F909 Attention-deficit hyperactivity disorder, unspecified type: Secondary | ICD-10-CM

## 2023-11-16 NOTE — Progress Notes (Signed)
   THERAPIST PROGRESS NOTE  Session Time: 3:30pm-4:30pm  Participation Level: Active  Behavioral Response: NeatAlertDepressed and Irritable  Type of Therapy: Individual Therapy  Treatment Goals addressed:  Goal: LTG: Reduce frequency, intensity, and duration of depression symptoms so that daily functioning is improved     Dates: Start:  06/30/23    Expected End:  12/31/23       Disciplines: Interdisciplinary, PROVIDER                  Goal: LTG: Increase coping skills to manage depression and improve ability to perform daily activities     Dates: Start:  06/30/23    Expected End:  12/31/23       Disciplines: Interdisciplinary, PROVIDER         ProgressTowards Goals: Progressing  Interventions: CBT  Summary: Tifini Reeder is a 28 y.o. female who presents with CPTSD, depression, ADHD.   Suicidal/Homicidal: Nowithout intent/plan  Therapist Response: [name] engaged well in individual session with clinician.  Clinician utilized CBT to process thoughts feelings and interactions.  [name] identified some improvement in depression from last week, however shared more experience with being frozen and unable to complete tasks around the house and at work.  [name] shared concern open Effexor  prescription and reported uncertainty about effectiveness.  [name] questions how much of her reasoning is associated with ADHD versus depression.  Clinician explored thought content and depressed mood.  Clinician reviewed coping skills and encouraged [name] to continue using those coping skills and task completion skills.  Plan: Return again in 1-2 weeks.  Diagnosis: Chronic post-traumatic stress disorder (PTSD)  MDD (major depressive disorder), recurrent episode, moderate (HCC)  Adult ADHD  Collaboration of Care: Psychiatrist AEB encouraged [facility] to discuss medication with Dr. Carvin  Patient/Guardian was advised Release of Information must be obtained prior to any record release in order to  collaborate their care with an outside provider. Patient/Guardian was advised if they have not already done so to contact the registration department to sign all necessary forms in order for us  to release information regarding their care.   Consent: Patient/Guardian gives verbal consent for treatment and assignment of benefits for services provided during this visit. Patient/Guardian expressed understanding and agreed to proceed.   [name] SAUNDERS Index, LCSW 11/16/2023

## 2023-11-17 ENCOUNTER — Ambulatory Visit (HOSPITAL_COMMUNITY): Payer: Commercial Managed Care - HMO | Admitting: Licensed Clinical Social Worker

## 2023-11-23 ENCOUNTER — Ambulatory Visit (HOSPITAL_COMMUNITY): Payer: Commercial Managed Care - HMO | Admitting: Licensed Clinical Social Worker

## 2023-11-24 ENCOUNTER — Ambulatory Visit (HOSPITAL_COMMUNITY): Payer: Self-pay | Admitting: Licensed Clinical Social Worker

## 2023-11-24 DIAGNOSIS — F4312 Post-traumatic stress disorder, chronic: Secondary | ICD-10-CM

## 2023-11-25 ENCOUNTER — Encounter (HOSPITAL_COMMUNITY): Payer: Self-pay | Admitting: Licensed Clinical Social Worker
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
## 2023-11-29 ENCOUNTER — Telehealth (HOSPITAL_COMMUNITY): Payer: Self-pay | Admitting: Psychiatry

## 2023-11-30 ENCOUNTER — Encounter (HOSPITAL_COMMUNITY): Payer: Self-pay | Admitting: Licensed Clinical Social Worker

## 2023-11-30 ENCOUNTER — Ambulatory Visit (INDEPENDENT_AMBULATORY_CARE_PROVIDER_SITE_OTHER): Payer: Self-pay | Admitting: Licensed Clinical Social Worker

## 2023-11-30 DIAGNOSIS — F4312 Post-traumatic stress disorder, chronic: Secondary | ICD-10-CM
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
## 2023-12-02 ENCOUNTER — Telehealth (HOSPITAL_COMMUNITY): Payer: Self-pay | Admitting: Psychiatry

## 2023-12-02 DIAGNOSIS — F3342 Major depressive disorder, recurrent, in full remission: Secondary | ICD-10-CM

## 2023-12-02 DIAGNOSIS — F4312 Post-traumatic stress disorder, chronic: Secondary | ICD-10-CM

## 2023-12-02 MED ORDER — BUPROPION HCL ER (XL) 300 MG PO TB24
300.0000 mg | ORAL_TABLET | Freq: Every day | ORAL | 2 refills | Status: DC
Start: 2023-12-02 — End: 2024-03-21
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
## 2023-12-03 ENCOUNTER — Encounter (HOSPITAL_COMMUNITY): Payer: Self-pay | Admitting: Psychiatry

## 2023-12-09 ENCOUNTER — Ambulatory Visit (INDEPENDENT_AMBULATORY_CARE_PROVIDER_SITE_OTHER): Payer: 59 | Admitting: Licensed Clinical Social Worker

## 2023-12-09 DIAGNOSIS — F4312 Post-traumatic stress disorder, chronic: Secondary | ICD-10-CM

## 2023-12-13 ENCOUNTER — Encounter (HOSPITAL_COMMUNITY): Payer: Self-pay | Admitting: Licensed Clinical Social Worker
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
## 2023-12-14 ENCOUNTER — Ambulatory Visit (INDEPENDENT_AMBULATORY_CARE_PROVIDER_SITE_OTHER): Payer: PRIVATE HEALTH INSURANCE | Admitting: Licensed Clinical Social Worker

## 2023-12-14 DIAGNOSIS — F4312 Post-traumatic stress disorder, chronic: Secondary | ICD-10-CM | POA: Diagnosis not present

## 2023-12-17 ENCOUNTER — Encounter (HOSPITAL_COMMUNITY): Payer: Self-pay | Admitting: Licensed Clinical Social Worker
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
## 2023-12-23 ENCOUNTER — Ambulatory Visit (HOSPITAL_COMMUNITY): Payer: Self-pay | Admitting: Licensed Clinical Social Worker

## 2023-12-28 ENCOUNTER — Ambulatory Visit (INDEPENDENT_AMBULATORY_CARE_PROVIDER_SITE_OTHER): Payer: 59 | Admitting: Licensed Clinical Social Worker

## 2023-12-28 DIAGNOSIS — F4312 Post-traumatic stress disorder, chronic: Secondary | ICD-10-CM

## 2023-12-29 ENCOUNTER — Encounter (HOSPITAL_COMMUNITY): Payer: Self-pay | Admitting: Licensed Clinical Social Worker
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
## 2024-01-05 ENCOUNTER — Ambulatory Visit (INDEPENDENT_AMBULATORY_CARE_PROVIDER_SITE_OTHER): Payer: 59 | Admitting: Licensed Clinical Social Worker

## 2024-01-05 DIAGNOSIS — F4312 Post-traumatic stress disorder, chronic: Secondary | ICD-10-CM | POA: Diagnosis not present
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
## 2024-01-06 ENCOUNTER — Telehealth (HOSPITAL_BASED_OUTPATIENT_CLINIC_OR_DEPARTMENT_OTHER): Payer: Self-pay | Admitting: Psychiatry

## 2024-01-06 DIAGNOSIS — F4312 Post-traumatic stress disorder, chronic: Secondary | ICD-10-CM | POA: Diagnosis not present

## 2024-01-06 DIAGNOSIS — F3342 Major depressive disorder, recurrent, in full remission: Secondary | ICD-10-CM | POA: Diagnosis not present

## 2024-01-06 MED ORDER — PROPRANOLOL HCL 10 MG PO TABS
10.0000 mg | ORAL_TABLET | Freq: Two times a day (BID) | ORAL | 1 refills | Status: DC
Start: 2024-01-06 — End: 2024-03-21

## 2024-01-07 ENCOUNTER — Encounter (HOSPITAL_COMMUNITY): Payer: Self-pay | Admitting: Psychiatry

## 2024-01-11 ENCOUNTER — Encounter (HOSPITAL_COMMUNITY): Payer: Self-pay | Admitting: Licensed Clinical Social Worker
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
## 2024-01-12 ENCOUNTER — Ambulatory Visit (INDEPENDENT_AMBULATORY_CARE_PROVIDER_SITE_OTHER): Payer: Self-pay | Admitting: Licensed Clinical Social Worker

## 2024-01-12 DIAGNOSIS — F4312 Post-traumatic stress disorder, chronic: Secondary | ICD-10-CM

## 2024-01-20 ENCOUNTER — Ambulatory Visit (INDEPENDENT_AMBULATORY_CARE_PROVIDER_SITE_OTHER): Payer: Self-pay | Admitting: Licensed Clinical Social Worker

## 2024-01-20 DIAGNOSIS — F4312 Post-traumatic stress disorder, chronic: Secondary | ICD-10-CM

## 2024-01-22 ENCOUNTER — Encounter (HOSPITAL_COMMUNITY): Payer: Self-pay | Admitting: Licensed Clinical Social Worker
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
## 2024-01-25 ENCOUNTER — Ambulatory Visit (INDEPENDENT_AMBULATORY_CARE_PROVIDER_SITE_OTHER): Payer: Self-pay | Admitting: Licensed Clinical Social Worker

## 2024-01-25 DIAGNOSIS — F4312 Post-traumatic stress disorder, chronic: Secondary | ICD-10-CM

## 2024-01-25 DIAGNOSIS — F331 Major depressive disorder, recurrent, moderate: Secondary | ICD-10-CM

## 2024-01-26 ENCOUNTER — Encounter (HOSPITAL_COMMUNITY): Payer: Self-pay | Admitting: Licensed Clinical Social Worker
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
## 2024-02-09 ENCOUNTER — Ambulatory Visit (HOSPITAL_COMMUNITY): Payer: Self-pay | Admitting: Licensed Clinical Social Worker

## 2024-02-10 ENCOUNTER — Ambulatory Visit (HOSPITAL_COMMUNITY): Payer: Self-pay | Admitting: Licensed Clinical Social Worker

## 2024-02-23 ENCOUNTER — Ambulatory Visit (INDEPENDENT_AMBULATORY_CARE_PROVIDER_SITE_OTHER): Payer: Self-pay | Admitting: Licensed Clinical Social Worker

## 2024-02-23 DIAGNOSIS — F4312 Post-traumatic stress disorder, chronic: Secondary | ICD-10-CM

## 2024-02-24 ENCOUNTER — Encounter (HOSPITAL_COMMUNITY): Payer: Self-pay | Admitting: Licensed Clinical Social Worker

## 2024-02-24 NOTE — Progress Notes (Signed)
 Virtual Visit via Video Note  I connected with Roshawna Mcmeans on 02/23/24 at  3:30 PM EDT by a video enabled telemedicine application and verified that I am speaking with the correct person using two identifiers.  Location: Patient: home Provider: home office   I discussed the limitations of evaluation and management by telemedicine and the availability of in person appointments. The patient expressed understanding and agreed to proceed.   I discussed the assessment and treatment plan with the patient. The patient was provided an opportunity to ask questions and all were answered. The patient agreed with the plan and demonstrated an understanding of the instructions.   The patient was advised to call back or seek an in-person evaluation if the symptoms worsen or if the condition fails to improve as anticipated.  I provided 55 minutes of non-face-to-face time during this encounter.   Seldon Dago, LCSW   THERAPIST PROGRESS NOTE  Session Time: 3:30pm-4:25pm  Participation Level: Active  Behavioral Response: Well GroomedAlertDepressed and Euthymic  Type of Therapy: Individual Therapy  Treatment Goals addressed:         Goal: LTG: Reduce frequency, intensity, and duration of depression symptoms so that daily functioning is improved     Dates: Start:  06/30/23    Expected End:  12/31/23       Disciplines: Interdisciplinary, PROVIDER                  Goal: LTG: Increase coping skills to manage depression and improve ability to perform daily activities     Dates: Start:  06/30/23    Expected End:  12/31/23       Disciplines: Interdisciplinary, PROVIDER         ProgressTowards Goals: Progressing  Interventions: CBT and Other: EFT Tapping  Summary: Luvern Mischke is a 28 y.o. female who presents with Chronic PTSD.   Suicidal/Homicidal: Nowithout intent/plan  Therapist Response: [name] engaged well in individual virtual session with Facilities manager. Clinician utilized CBT to  process thoughts, feelings, and interactions. Clinician explored anxiety levels and current stressors. [name] shared improvement in the level of anxiety that presented in nausea and vomiting. Clinician explored changes and noted that finances have been settled and they got paid for work completed. However, [name] shared that anxiety sxs will still manifest in her physical body long after her mind is calm. Clinician provided feedback and psychoeducation about the vagus nerve and ways to calm it down physically, even when the mind seems to be calmer. Clinician shared EFT Tapping and explained that regular use of this exercise can have longer term effects to reduce the physical state of the body in anxiety. [name] agreed to try this before bed and throughout the day.   Plan: Return again in 55 weeks.  Diagnosis: Chronic post-traumatic stress disorder (PTSD)  Collaboration of Care: Patient refused AEB none required  Patient/Guardian was advised Release of Information must be obtained prior to any record release in order to collaborate their care with an outside provider. Patient/Guardian was advised if they have not already done so to contact the registration department to sign all necessary forms in order for us  to release information regarding their care.   Consent: Patient/Guardian gives verbal consent for treatment and assignment of benefits for services provided during this visit. Patient/Guardian expressed understanding and agreed to proceed.   Merleen Stare [place], LCSW 02/24/2024

## 2024-02-29 ENCOUNTER — Ambulatory Visit (INDEPENDENT_AMBULATORY_CARE_PROVIDER_SITE_OTHER): Payer: Self-pay | Admitting: Licensed Clinical Social Worker

## 2024-02-29 ENCOUNTER — Telehealth (HOSPITAL_COMMUNITY): Admitting: Psychiatry

## 2024-02-29 DIAGNOSIS — F4312 Post-traumatic stress disorder, chronic: Secondary | ICD-10-CM

## 2024-03-01 ENCOUNTER — Encounter (HOSPITAL_COMMUNITY): Payer: Self-pay | Admitting: Licensed Clinical Social Worker
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
## 2024-03-16 ENCOUNTER — Ambulatory Visit (INDEPENDENT_AMBULATORY_CARE_PROVIDER_SITE_OTHER): Payer: Self-pay | Admitting: Licensed Clinical Social Worker

## 2024-03-16 DIAGNOSIS — F4312 Post-traumatic stress disorder, chronic: Secondary | ICD-10-CM
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
## 2024-03-21 ENCOUNTER — Encounter (HOSPITAL_COMMUNITY): Payer: Self-pay | Admitting: Psychiatry

## 2024-03-21 ENCOUNTER — Telehealth (HOSPITAL_BASED_OUTPATIENT_CLINIC_OR_DEPARTMENT_OTHER): Payer: Self-pay | Admitting: Psychiatry

## 2024-03-21 DIAGNOSIS — F331 Major depressive disorder, recurrent, moderate: Secondary | ICD-10-CM

## 2024-03-21 DIAGNOSIS — F4312 Post-traumatic stress disorder, chronic: Secondary | ICD-10-CM

## 2024-03-21 MED ORDER — BUPROPION HCL ER (XL) 150 MG PO TB24
450.0000 mg | ORAL_TABLET | Freq: Every day | ORAL | 2 refills | Status: DC
Start: 2024-03-21 — End: 2024-06-22

## 2024-03-21 MED ORDER — PROPRANOLOL HCL 20 MG PO TABS
ORAL_TABLET | ORAL | 1 refills | Status: DC
Start: 2024-03-21 — End: 2024-06-22

## 2024-03-27 ENCOUNTER — Encounter (HOSPITAL_COMMUNITY): Payer: Self-pay | Admitting: Licensed Clinical Social Worker

## 2024-03-27 NOTE — Progress Notes (Signed)
 Virtual Visit via Video Note  I connected with Alexandria [name] on 03/27/24 at  3:30 PM EDT by a video enabled telemedicine application and verified that I am speaking with the correct person using two identifiers.  Location: Patient: home Provider: home office   I discussed the limitations of evaluation and management by telemedicine and the availability of in person appointments. The patient expressed understanding and agreed to proceed.   I discussed the assessment and treatment plan with the patient. The patient was provided an opportunity to ask questions and all were answered. The patient agreed with the plan and demonstrated an understanding of the instructions.   The patient was advised to call back or seek an in-person evaluation if the symptoms worsen or if the condition fails to improve as anticipated.  I provided 45 minutes of non-face-to-face time during this encounter.   Seldon Dago, LCSW   THERAPIST PROGRESS NOTE  Session Time: 3:30pm-4:15pm  Participation Level: Active  Behavioral Response: NeatAlertEuthymic  Type of Therapy: Individual Therapy  Treatment Goals addressed:  Goal: LTG: Reduce frequency, intensity, and duration of depression symptoms so that daily functioning is improved     Dates: Start:  06/30/23    Expected End:  12/31/23       Disciplines: Interdisciplinary, PROVIDER                  Goal: LTG: Increase coping skills to manage depression and improve ability to perform daily activities     Dates: Start:  06/30/23    Expected End:  12/31/23       Disciplines: Interdisciplinary, PROVIDER       ProgressTowards Goals: Progressing  Interventions: CBT  Summary: Alexandria [name] is a 28 y.o. female who presents with C-PTSD.   Suicidal/Homicidal: Nowithout intent/plan  Therapist Response: [name] engaged well in individual virtual session with Facilities manager. Clinician utilized CBT to process thoughts, feelings, and interactions. Clinician  explored mood and coping skills. Clinician identified improvement in mood since starting new relationship. Clinician explored the differences in this relationship, as well as the value seen by new partner re: mental health and wellness. Clinician identified the importance of taking it slow, being open about future plans, and the boundaries put in place. Clinician reflected improvement in appetite and anxiety.  Plan: Return again in 2 weeks.  Diagnosis: Chronic post-traumatic stress disorder (PTSD)  Collaboration of Care: Patient refused AEB none required  Patient/Guardian was advised Release of Information must be obtained prior to any record release in order to collaborate their care with an outside provider. Patient/Guardian was advised if they have not already done so to contact the registration department to sign all necessary forms in order for us  to release information regarding their care.   Consent: Patient/Guardian gives verbal consent for treatment and assignment of benefits for services provided during this visit. Patient/Guardian expressed understanding and agreed to proceed.   Merleen Stare [place], LCSW 03/27/2024

## 2024-03-30 ENCOUNTER — Ambulatory Visit (INDEPENDENT_AMBULATORY_CARE_PROVIDER_SITE_OTHER): Payer: Self-pay | Admitting: Licensed Clinical Social Worker

## 2024-03-30 DIAGNOSIS — F4312 Post-traumatic stress disorder, chronic: Secondary | ICD-10-CM

## 2024-04-04 ENCOUNTER — Encounter (HOSPITAL_COMMUNITY): Payer: Self-pay | Admitting: Licensed Clinical Social Worker
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
## 2024-04-11 ENCOUNTER — Ambulatory Visit (INDEPENDENT_AMBULATORY_CARE_PROVIDER_SITE_OTHER): Payer: Self-pay | Admitting: Licensed Clinical Social Worker

## 2024-04-11 DIAGNOSIS — F4312 Post-traumatic stress disorder, chronic: Secondary | ICD-10-CM

## 2024-04-14 ENCOUNTER — Encounter (HOSPITAL_COMMUNITY): Payer: Self-pay | Admitting: Licensed Clinical Social Worker

## 2024-04-14 NOTE — Progress Notes (Signed)
   THERAPIST PROGRESS NOTE  Session Time: 2:30pm-3:30pm  Participation Level: Active  Behavioral Response: Well GroomedAlertDepressed and Irritable  Type of Therapy: Individual Therapy  Treatment Goals addressed:  Goal: LTG: Reduce frequency, intensity, and duration of depression symptoms so that daily functioning is improved     Dates: Start:  06/30/23    Expected End:  12/31/23       Disciplines: Interdisciplinary, PROVIDER                  Goal: LTG: Increase coping skills to manage depression and improve ability to perform daily activities     Dates: Start:  06/30/23    Expected End:  12/31/23       Disciplines: Interdisciplinary, PROVIDER       ProgressTowards Goals: Progressing  Interventions: CBT  Summary: Alexandria [name] is a 28 y.o. female who presents with CPTSD.   Suicidal/Homicidal: Nowithout intent/plan  Therapist Response: [name] engaged well in individual and in person session with clinician.  Clinician utilized CBT to process thoughts feelings and interactions.  Clinician identified ongoing stress about finances and recurrent posttraumatic memories and feelings.  Clinician processed the memories that continue to resurface.  Clinician explored how these memories are triggering anxiety, anger, and sadness.  Clinician explored the continuing reminders and similar behaviors from multiple people in their life.  Clinician explored insecurities, low self-esteem, and problems with motivation.  Client identified ongoing challenges with motivation and focus due to not having ADHD medication.  Clinician assisted client in contacting [place] [facility] to schedule an evaluation.  Plan: Return again in 2-4 weeks.  Diagnosis: Chronic post-traumatic stress disorder (PTSD)  Collaboration of Care: Psychiatrist AEB reviewed notes from Dr. Sharalyn Dasen  Patient/Guardian was advised Release of Information must be obtained prior to any record release in order to collaborate  their care with an outside provider. Patient/Guardian was advised if they have not already done so to contact the registration department to sign all necessary forms in order for us  to release information regarding their care.   Consent: Patient/Guardian gives verbal consent for treatment and assignment of benefits for services provided during this visit. Patient/Guardian expressed understanding and agreed to proceed.   [name], LCSW 04/14/2024
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
## 2024-04-25 ENCOUNTER — Ambulatory Visit (HOSPITAL_COMMUNITY): Payer: Self-pay | Admitting: Licensed Clinical Social Worker

## 2024-04-28 ENCOUNTER — Telehealth (HOSPITAL_COMMUNITY): Payer: Self-pay | Admitting: Psychiatry

## 2024-05-22 ENCOUNTER — Ambulatory Visit (HOSPITAL_COMMUNITY)
Admission: EM | Admit: 2024-05-22 | Discharge: 2024-05-22 | Disposition: A | Discharge status: Home / Self Care | Attending: Internal Medicine | Admitting: Internal Medicine

## 2024-05-22 ENCOUNTER — Other Ambulatory Visit: Payer: Self-pay

## 2024-05-22 ENCOUNTER — Encounter (HOSPITAL_COMMUNITY): Payer: Self-pay | Admitting: *Deleted

## 2024-05-22 DIAGNOSIS — T7840XA Allergy, unspecified, initial encounter: Secondary | ICD-10-CM | POA: Diagnosis not present

## 2024-05-22 DIAGNOSIS — T63461A Toxic effect of venom of wasps, accidental (unintentional), initial encounter: Secondary | ICD-10-CM | POA: Diagnosis not present

## 2024-05-22 MED ORDER — METHYLPREDNISOLONE SODIUM SUCC 125 MG IJ SOLR
80.0000 mg | Freq: Once | INTRAMUSCULAR | Status: AC
  Administered 2024-05-22: 80 mg via INTRAMUSCULAR

## 2024-05-22 MED ORDER — METHYLPREDNISOLONE SODIUM SUCC 125 MG IJ SOLR
INTRAMUSCULAR | Status: AC
  Filled 2024-05-22: qty 2

## 2024-05-22 MED ORDER — METHYLPREDNISOLONE 4 MG PO TBPK: ORAL_TABLET | ORAL | 0 refills | Status: AC

## 2024-05-22 NOTE — Discharge Instructions (Addendum)
 Allergic reaction to wasp sting with symptoms in the throat and chest symptoms. We will treat this aggressively given the symptoms. We will treat with the following:  Medrol  injection given today. This is a steroid to help with swelling and allergic reaction. Start 05/23/24: Medrol  dose pack. Follow package insert. Monitor symptoms, if you develop worsening symptoms such as shortness of breath, swelling in the lips, throat or mouth, then go to the emergency room. May return to urgent care as needed.

## 2024-05-22 NOTE — ED Provider Notes (Signed)
 [facility]    CSN: [id number] Arrival date & time: 05/22/24  1445      History   Chief Complaint Chief Complaint  Patient presents with   Insect Bite    HPI Alexandria [name] is a 28 y.o. female.   28 y.o. female who presents to urgent care with complaints of wasp sting that occurred today. She reports it happened in her house. She felt tightness in her chest and a weird feeling in the back of her throat. She called her PCP and they told her to come to urgent care.  She reports that initially the symptoms had gotten worse but now that she is back in her room the symptoms are improving.  She does have some itching in the area. She denies any shortness of breath at current, chest pain, swelling.      Past Medical History:  Diagnosis Date   ADHD    Depression    PTSD (post-traumatic stress disorder)     Patient Active Problem List   Diagnosis Date Noted   BCP (birth control pills) initiation 01/27/2023   Adult ADHD 01/22/2020   Chronic post-traumatic stress disorder (PTSD) 09/11/2019   Overdose of benzodiazepine, intentional self-harm, initial encounter (HCC)    Major depressive disorder, recurrent episode, in full remission (HCC) 07/30/2019    History reviewed. No pertinent surgical history.  OB History     Gravida  1   Para      Term      Preterm      AB      Living         SAB      IAB      Ectopic      Multiple      Live Births               Home Medications    Prior to Admission medications   Medication Sig Start Date End Date Taking? Authorizing Provider  buPROPion  (WELLBUTRIN  XL) 150 MG 24 hr tablet Take 3 tablets (450 mg total) by mouth daily. 03/21/24  Yes Carvin Arvella HERO, MD  methylPREDNISolone  (MEDROL  DOSEPAK) 4 MG TBPK tablet Follow package insert 05/22/24  Yes Teresa Almarie LABOR, PA-C  propranolol  (INDERAL ) 20 MG tablet Take 1 tablet (20 mg total) by mouth 2 (two) times daily. May also take 1 tablet (20 mg total) daily  as needed. 03/21/24  Yes Carvin Arvella HERO, MD  drospirenone -ethinyl estradiol  (YAZ) 3-0.02 MG tablet Take 1 tablet by mouth daily. 01/27/23   Carvin Arvella HERO, MD    Family History Family History  Problem Relation Age of Onset   OCD Mother    Depression Father    ADD / ADHD Paternal Grandmother     Social History Social History   Tobacco Use   Smoking status: Never   Smokeless tobacco: Never  Vaping Use   Vaping status: Never Used  Substance Use Topics   Alcohol use: Yes   Drug use: Never     Allergies   Patient has no known allergies.   Review of Systems Review of Systems  Constitutional:  Negative for chills and fever.  HENT:  Positive for trouble swallowing. Negative for ear pain and sore throat.   Eyes:  Negative for pain and visual disturbance.  Respiratory:  Positive for chest tightness. Negative for cough and shortness of breath.   Cardiovascular:  Negative for chest pain and palpitations.  Gastrointestinal:  Negative for abdominal pain and vomiting.  Genitourinary:  Negative for dysuria and hematuria.  Musculoskeletal:  Negative for arthralgias and back pain.  Skin:  Positive for rash. Negative for color change.  Neurological:  Negative for seizures and syncope.  All other systems reviewed and are negative.    Physical Exam Triage Vital Signs ED Triage Vitals  Encounter Vitals Group     BP 05/22/24 1542 116/79     Girls Systolic BP Percentile --      Girls Diastolic BP Percentile --      Boys Systolic BP Percentile --      Boys Diastolic BP Percentile --      Pulse Rate 05/22/24 1542 87     Resp 05/22/24 1542 18     Temp 05/22/24 1542 98.2 F (36.8 C)     Temp src --      SpO2 05/22/24 1542 98 %     Weight --      Height --      Head Circumference --      Peak Flow --      Pain Score 05/22/24 1544 0     Pain Loc --      Pain Education --      Exclude from Growth Chart --    No data found.  Updated Vital Signs BP 116/79   Pulse 87   Temp  98.2 F (36.8 C)   Resp 18   LMP 05/08/2024   SpO2 98%   Visual Acuity Right Eye Distance:   Left Eye Distance:   Bilateral Distance:    Right Eye Near:   Left Eye Near:    Bilateral Near:     Physical Exam Vitals and nursing note reviewed.  Constitutional:      General: She is not in acute distress.    Appearance: She is well-developed.  HENT:     Head: Normocephalic and atraumatic.  Eyes:     Conjunctiva/sclera: Conjunctivae normal.  Cardiovascular:     Rate and Rhythm: Normal rate and regular rhythm.     Heart sounds: No murmur heard. Pulmonary:     Effort: Pulmonary effort is normal. No respiratory distress.     Breath sounds: Normal breath sounds.  Abdominal:     Palpations: Abdomen is soft.     Tenderness: There is no abdominal tenderness.  Musculoskeletal:        General: No swelling.     Cervical back: Neck supple.  Skin:    General: Skin is warm and dry.     Capillary Refill: Capillary refill takes less than 2 seconds.      Neurological:     Mental Status: She is alert.  Psychiatric:        Mood and Affect: Mood normal.      UC Treatments / Results  Labs (all labs ordered are listed, but only abnormal results are displayed) Labs Reviewed - No data to display  EKG   Radiology No results found.  Procedures Procedures (including critical care time)  Medications Ordered in UC Medications  methylPREDNISolone  sodium succinate (SOLU-MEDROL ) 125 mg/2 mL injection 80 mg (has no administration in time range)    Initial Impression / Assessment and Plan / UC Course  I have reviewed the triage vital signs and the nursing notes.  Pertinent labs & imaging results that were available during my care of the patient were reviewed by me and considered in my medical decision making (see chart for details).     Allergic reaction, initial encounter  Wasp sting, accidental or unintentional, initial encounter   Allergic reaction to wasp sting with  symptoms in the throat and chest symptoms. We will treat this aggressively given the symptoms. We will treat with the following:  Medrol  injection given today. This is a steroid to help with swelling and allergic reaction. Start 05/23/24: Medrol  dose pack. Follow package insert. Monitor symptoms, if you develop worsening symptoms such as shortness of breath, swelling in the lips, throat or mouth, then go to the emergency room. May return to urgent care as needed.   Final Clinical Impressions(s) / UC Diagnoses   Final diagnoses:  Allergic reaction, initial encounter  Wasp sting, accidental or unintentional, initial encounter     Discharge Instructions      Allergic reaction to wasp sting with symptoms in the throat and chest symptoms. We will treat this aggressively given the symptoms. We will treat with the following:  Medrol  injection given today. This is a steroid to help with swelling and allergic reaction. Start 05/23/24: Medrol  dose pack. Follow package insert. Monitor symptoms, if you develop worsening symptoms such as shortness of breath, swelling in the lips, throat or mouth, then go to the emergency room. May return to urgent care as needed.     ED Prescriptions     Medication Sig Dispense Auth. Provider   methylPREDNISolone  (MEDROL  DOSEPAK) 4 MG TBPK tablet Follow package insert 1 each Teresa Almarie LABOR, PA-C      PDMP not reviewed this encounter.   Teresa Almarie LABOR, PA-C 05/22/24 1631

## 2024-05-22 NOTE — ED Triage Notes (Addendum)
 PT reports wasp sting today. Diffculty swallowing chest tightness

## 2024-05-24 ENCOUNTER — Ambulatory Visit (HOSPITAL_COMMUNITY): Payer: PRIVATE HEALTH INSURANCE | Admitting: Licensed Clinical Social Worker

## 2024-05-24 DIAGNOSIS — F4312 Post-traumatic stress disorder, chronic: Secondary | ICD-10-CM | POA: Diagnosis not present

## 2024-05-31 ENCOUNTER — Encounter (HOSPITAL_COMMUNITY): Payer: Self-pay | Admitting: Licensed Clinical Social Worker
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
## 2024-06-13 ENCOUNTER — Ambulatory Visit (INDEPENDENT_AMBULATORY_CARE_PROVIDER_SITE_OTHER): Payer: PRIVATE HEALTH INSURANCE | Admitting: Licensed Clinical Social Worker

## 2024-06-13 DIAGNOSIS — F4312 Post-traumatic stress disorder, chronic: Secondary | ICD-10-CM

## 2024-06-18 ENCOUNTER — Encounter (HOSPITAL_COMMUNITY): Payer: Self-pay | Admitting: Licensed Clinical Social Worker
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
## 2024-06-22 ENCOUNTER — Telehealth (HOSPITAL_BASED_OUTPATIENT_CLINIC_OR_DEPARTMENT_OTHER): Payer: PRIVATE HEALTH INSURANCE | Admitting: Psychiatry

## 2024-06-22 ENCOUNTER — Encounter (HOSPITAL_COMMUNITY): Payer: Self-pay | Admitting: Psychiatry

## 2024-06-22 DIAGNOSIS — F331 Major depressive disorder, recurrent, moderate: Secondary | ICD-10-CM | POA: Diagnosis not present

## 2024-06-22 DIAGNOSIS — F4312 Post-traumatic stress disorder, chronic: Secondary | ICD-10-CM

## 2024-06-22 MED ORDER — PROPRANOLOL HCL 20 MG PO TABS: ORAL_TABLET | ORAL | 1 refills | Status: DC

## 2024-06-27 ENCOUNTER — Encounter (HOSPITAL_COMMUNITY): Payer: Self-pay | Admitting: Licensed Clinical Social Worker

## 2024-06-27 ENCOUNTER — Ambulatory Visit (INDEPENDENT_AMBULATORY_CARE_PROVIDER_SITE_OTHER): Payer: PRIVATE HEALTH INSURANCE | Admitting: Licensed Clinical Social Worker

## 2024-06-27 DIAGNOSIS — F4312 Post-traumatic stress disorder, chronic: Secondary | ICD-10-CM | POA: Diagnosis not present

## 2024-06-27 NOTE — Progress Notes (Signed)
   THERAPIST PROGRESS NOTE  Session Time: 3:35pm-4:30pm  Participation Level: Active  Behavioral Response: Well GroomedAlertAnxious and Euthymic  Type of Therapy: Individual Therapy  Treatment Goals addressed:  Goal: LTG: Reduce frequency, intensity, and duration of depression symptoms so that daily functioning is improved     Dates: Start:  06/30/23    Expected End:  12/31/23       Disciplines: Interdisciplinary, PROVIDER                  Goal: LTG: Increase coping skills to manage depression and improve ability to perform daily activities     Dates: Start:  06/30/23    Expected End:  12/31/23       Disciplines: Interdisciplinary, PROVIDER      ProgressTowards Goals: Progressing  Interventions: CBT  Summary: Henriette Hesser is a 28 y.o. female who presents with CPTSD.   Suicidal/Homicidal: Nowithout intent/plan  Therapist Response: [name] engaged well in individual in person session with clinician. Clinician utilized CBT to process thoughts, feelings, and interactions. Clinician explored current mood and thought status. [name] shared improvement in mood and overall energy level since discontinuing medications. However, they shared increase in intrusive thoughts that they were concerned about. [name] shared that the intrusive thoughts involved harming her pets, and reported that this is not a possible or likely scenario at all. Clinician processed ways to manage intrusive thoughts, such as using CBT thought stopping techniques. Clinician identified potential medication options to address these thoughts, but [name] shared she is not wanting to go back on medications until her ADHD testing is completed.  Clinician discussed IQ testing that occurred yesterday. [name] reported the results will not be available until 9/15 and a full diagnosis session will be in late September.  Clinician discussed potential red flags in current relationship and provided time and space for processing ways to  address these issues, to decide if these are fixable problems, and to make a decision based on partner's decisions.   Plan: Return again in 2 weeks.  Diagnosis: Chronic post-traumatic stress disorder (PTSD)  Collaboration of Care: Psychiatrist AEB updated Dr. Carvin and shared update about intrusive thoughts.  Patient/Guardian was advised Release of Information must be obtained prior to any record release in order to collaborate their care with an outside provider. Patient/Guardian was advised if they have not already done so to contact the registration department to sign all necessary forms in order for us  to release information regarding their care.   Consent: Patient/Guardian gives verbal consent for treatment and assignment of benefits for services provided during this visit. Patient/Guardian expressed understanding and agreed to proceed.   [name] SAUNDERS [place], LCSW 06/27/2024

## 2024-07-11 ENCOUNTER — Ambulatory Visit (INDEPENDENT_AMBULATORY_CARE_PROVIDER_SITE_OTHER): Payer: PRIVATE HEALTH INSURANCE | Admitting: Licensed Clinical Social Worker

## 2024-07-11 DIAGNOSIS — F4312 Post-traumatic stress disorder, chronic: Secondary | ICD-10-CM

## 2024-07-17 ENCOUNTER — Encounter (HOSPITAL_COMMUNITY): Payer: Self-pay | Admitting: Licensed Clinical Social Worker

## 2024-07-17 NOTE — Progress Notes (Signed)
 Virtual Visit via Video Note  I connected with Alexandria [name] on 07/11/24 at  3:30 PM EDT by a video enabled telemedicine application and verified that I am speaking with the correct person using two identifiers.  Location: Patient: home Provider: office   I discussed the limitations of evaluation and management by telemedicine and the availability of in person appointments. The patient expressed understanding and agreed to proceed. I discussed the assessment and treatment plan with the patient. The patient was provided an opportunity to ask questions and all were answered. The patient agreed with the plan and demonstrated an understanding of the instructions.   The patient was advised to call back or seek an in-person evaluation if the symptoms worsen or if the condition fails to improve as anticipated.  I provided 45 minutes of non-face-to-face time during this encounter.   Alexandria JONELLE Rosser, LCSW   THERAPIST PROGRESS NOTE  Session Time: 3:30pm-4:15pm  Participation Level: Active  Behavioral Response: NeatAlertDysphoric  Type of Therapy: Individual Therapy  Treatment Goals addressed:      Goal: LTG: Reduce frequency, intensity, and duration of depression symptoms so that daily functioning is improved     Dates: Start:  06/30/23    Expected End:  12/31/23       Disciplines: Interdisciplinary, PROVIDER                  Goal: LTG: Increase coping skills to manage depression and improve ability to perform daily activities     Dates: Start:  06/30/23    Expected End:  12/31/23       Disciplines: Interdisciplinary, PROVIDER      ProgressTowards Goals: Progressing  Interventions: CBT  Summary: Alexandria [name] is a 28 y.o. female who presents with CPTSD.   Suicidal/Homicidal: Nowithout intent/plan  Therapist Response: Alexandria [name] engaged well in individual virtual session with Facilities manager. Clinician utilized CBT to process thoughts, feelings, and interactions. Alexandria [name] shared recent  decision to end relationship with partner due to concerns over maturity. Clinician reflected the thought that went into this decision and noted the importance of seeing red flags when they are presented, without having to wait for more problems to arise. Clinician identified concerns with attaching to healthy people, rather than traumatized people. Clinician reflected the attraction to someone who understands you, but the struggle of being with someone who is not on their own healing journey.   Plan: Return again in 2 weeks.  Diagnosis: Chronic post-traumatic stress disorder (PTSD)  Collaboration of Care: Patient refused AEB waiting on confirmation appt about diagnosis   Patient/Guardian was advised Release of Information must be obtained prior to any record release in order to collaborate their care with an outside provider. Patient/Guardian was advised if they have not already done so to contact the registration department to sign all necessary forms in order for us  to release information regarding their care.   Consent: Patient/Guardian gives verbal consent for treatment and assignment of benefits for services provided during this visit. Patient/Guardian expressed understanding and agreed to proceed.   Alexandria JONELLE [place], LCSW 07/17/2024

## 2024-07-25 ENCOUNTER — Ambulatory Visit (HOSPITAL_COMMUNITY): Admitting: Licensed Clinical Social Worker

## 2024-07-26 ENCOUNTER — Ambulatory Visit (INDEPENDENT_AMBULATORY_CARE_PROVIDER_SITE_OTHER): Payer: PRIVATE HEALTH INSURANCE | Admitting: Licensed Clinical Social Worker

## 2024-07-26 ENCOUNTER — Encounter (HOSPITAL_COMMUNITY): Payer: Self-pay | Admitting: Licensed Clinical Social Worker

## 2024-07-26 DIAGNOSIS — F4312 Post-traumatic stress disorder, chronic: Secondary | ICD-10-CM | POA: Diagnosis not present
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
## 2024-08-08 ENCOUNTER — Encounter (HOSPITAL_COMMUNITY): Payer: Self-pay | Admitting: Licensed Clinical Social Worker

## 2024-08-08 ENCOUNTER — Ambulatory Visit (INDEPENDENT_AMBULATORY_CARE_PROVIDER_SITE_OTHER): Payer: PRIVATE HEALTH INSURANCE | Admitting: Licensed Clinical Social Worker

## 2024-08-08 DIAGNOSIS — F4312 Post-traumatic stress disorder, chronic: Secondary | ICD-10-CM

## 2024-08-08 NOTE — Progress Notes (Signed)
   THERAPIST PROGRESS NOTE  Session Time: 2:30pm-3:25pm  Participation Level: Active  Behavioral Response: Well GroomedAlertDepressed  Type of Therapy: Individual Therapy  Treatment Goals addressed:  Goal: LTG: Reduce frequency, intensity, and duration of depression symptoms so that daily functioning is improved     Dates: Start:  06/30/23    Expected End:  12/31/23       Disciplines: Interdisciplinary, PROVIDER                  Goal: LTG: Increase coping skills to manage depression and improve ability to perform daily activities     Dates: Start:  06/30/23    Expected End:  12/31/23       Disciplines: Interdisciplinary, PROVIDER       ProgressTowards Goals: Progressing  Interventions: CBT  Summary: Alexandria [name] is a 28 y.o. female who presents with CPTSD.   Suicidal/Homicidal: Nowithout intent/plan  Therapist Response: [name] engaged well in individual virtual session with Facilities manager. Clinician utilized CBT to process thoughts, feelings, and interactions. [name] shared that they received the results of their testing, but the official documentation would not be available until 2 more weeks. [name] shared the outcomes include: ADHD, combined type, severe,  Autism Spectrum Disorder, and PTSD. [name] shared that they specifically stated that they do not have OCD or a personality disorder.   Clinician processed recent run in with old friends and received an apology from someone that was unexpected. [name] shared the shock and the validation that their feelings were real and appropriate. Clinician processed how this impacted the rest of the day and evening, noting a sense of relaxation and peace. [name] shared concern about future relationships and reducing co-dependency tendencies. Clinician processed the difference between being supportive and being co-dependent. Clinician also identified red flags that may be seen early in the relationship.   Plan: Return again in 2 weeks.  Diagnosis:  Chronic post-traumatic stress disorder (PTSD)  Collaboration of Care: Psychiatrist AEB updated Dr. Carvin and [facility]) AEB contacted [facility] for further documentation of diagnoses  Patient/Guardian was advised Release of Information must be obtained prior to any record release in order to collaborate their care with an outside provider. Patient/Guardian was advised if they have not already done so to contact the registration department to sign all necessary forms in order for us  to release information regarding their care.   Consent: Patient/Guardian gives verbal consent for treatment and assignment of benefits for services provided during this visit. Patient/Guardian expressed understanding and agreed to proceed.   [name] SAUNDERS [place], LCSW 08/08/2024
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
## 2024-08-15 ENCOUNTER — Ambulatory Visit (HOSPITAL_BASED_OUTPATIENT_CLINIC_OR_DEPARTMENT_OTHER): Payer: PRIVATE HEALTH INSURANCE | Admitting: Psychiatry

## 2024-08-15 ENCOUNTER — Encounter (HOSPITAL_COMMUNITY): Payer: Self-pay | Admitting: Psychiatry

## 2024-08-15 ENCOUNTER — Other Ambulatory Visit: Payer: Self-pay

## 2024-08-15 VITALS — BP 114/76 | HR 66 | Ht 63.0 in | Wt 126.0 lb

## 2024-08-15 DIAGNOSIS — F331 Major depressive disorder, recurrent, moderate: Secondary | ICD-10-CM

## 2024-08-15 DIAGNOSIS — F902 Attention-deficit hyperactivity disorder, combined type: Secondary | ICD-10-CM | POA: Diagnosis not present

## 2024-08-15 DIAGNOSIS — F4312 Post-traumatic stress disorder, chronic: Secondary | ICD-10-CM

## 2024-08-15 MED ORDER — LISDEXAMFETAMINE DIMESYLATE 20 MG PO CAPS: 20.0000 mg | ORAL_CAPSULE | Freq: Every day | ORAL | 0 refills | Status: DC

## 2024-08-15 MED ORDER — PROPRANOLOL HCL 20 MG PO TABS: ORAL_TABLET | ORAL | 1 refills | Status: AC

## 2024-08-22 ENCOUNTER — Ambulatory Visit (HOSPITAL_COMMUNITY): Admitting: Licensed Clinical Social Worker

## 2024-08-23 ENCOUNTER — Encounter (HOSPITAL_COMMUNITY): Payer: Self-pay | Admitting: Licensed Clinical Social Worker

## 2024-08-23 ENCOUNTER — Ambulatory Visit (INDEPENDENT_AMBULATORY_CARE_PROVIDER_SITE_OTHER): Payer: PRIVATE HEALTH INSURANCE | Admitting: Licensed Clinical Social Worker

## 2024-08-23 DIAGNOSIS — F4312 Post-traumatic stress disorder, chronic: Secondary | ICD-10-CM | POA: Diagnosis not present

## 2024-08-23 DIAGNOSIS — F902 Attention-deficit hyperactivity disorder, combined type: Secondary | ICD-10-CM
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
## 2024-09-07 ENCOUNTER — Encounter (HOSPITAL_COMMUNITY): Payer: Self-pay | Admitting: Licensed Clinical Social Worker

## 2024-09-07 ENCOUNTER — Ambulatory Visit (INDEPENDENT_AMBULATORY_CARE_PROVIDER_SITE_OTHER): Payer: PRIVATE HEALTH INSURANCE | Admitting: Licensed Clinical Social Worker

## 2024-09-07 DIAGNOSIS — F4312 Post-traumatic stress disorder, chronic: Secondary | ICD-10-CM

## 2024-09-07 DIAGNOSIS — F902 Attention-deficit hyperactivity disorder, combined type: Secondary | ICD-10-CM | POA: Diagnosis not present
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
## 2024-09-14 ENCOUNTER — Telehealth (HOSPITAL_COMMUNITY): Payer: PRIVATE HEALTH INSURANCE | Admitting: Psychiatry

## 2024-09-14 ENCOUNTER — Encounter (HOSPITAL_COMMUNITY): Payer: Self-pay | Admitting: Psychiatry

## 2024-09-14 DIAGNOSIS — F902 Attention-deficit hyperactivity disorder, combined type: Secondary | ICD-10-CM

## 2024-09-14 DIAGNOSIS — F331 Major depressive disorder, recurrent, moderate: Secondary | ICD-10-CM

## 2024-09-14 DIAGNOSIS — F4312 Post-traumatic stress disorder, chronic: Secondary | ICD-10-CM | POA: Diagnosis not present

## 2024-09-14 MED ORDER — LISDEXAMFETAMINE DIMESYLATE 40 MG PO CAPS: 40.0000 mg | ORAL_CAPSULE | Freq: Every day | ORAL | 0 refills | Status: DC

## 2024-09-19 ENCOUNTER — Ambulatory Visit (INDEPENDENT_AMBULATORY_CARE_PROVIDER_SITE_OTHER): Payer: PRIVATE HEALTH INSURANCE | Admitting: Licensed Clinical Social Worker

## 2024-09-19 DIAGNOSIS — F4312 Post-traumatic stress disorder, chronic: Secondary | ICD-10-CM | POA: Diagnosis not present

## 2024-09-19 DIAGNOSIS — F902 Attention-deficit hyperactivity disorder, combined type: Secondary | ICD-10-CM | POA: Diagnosis not present

## 2024-09-20 ENCOUNTER — Encounter (HOSPITAL_COMMUNITY): Payer: Self-pay | Admitting: Licensed Clinical Social Worker

## 2024-09-20 NOTE — Progress Notes (Signed)
 Virtual Visit via Video Note  I connected with Alexandria [name] on 09/19/24 at  9:00 AM EST by a video enabled telemedicine application and verified that I am speaking with the correct person using two identifiers.  Location: Patient: home Provider: office   I discussed the limitations of evaluation and management by telemedicine and the availability of in person appointments. The patient expressed understanding and agreed to proceed.   I discussed the assessment and treatment plan with the patient. The patient was provided an opportunity to ask questions and all were answered. The patient agreed with the plan and demonstrated an understanding of the instructions.   The patient was advised to call back or seek an in-person evaluation if the symptoms worsen or if the condition fails to improve as anticipated.  I provided 45 minutes of non-face-to-face time during this encounter.   Alexandria JONELLE Rosser, LCSW   THERAPIST PROGRESS NOTE  Session Time: 9:00am-9:45am  Participation Level: Active  Behavioral Response: CasualAlertEuthymic  Type of Therapy: Individual Therapy  Treatment Goals addressed:  Goal: LTG: Reduce frequency, intensity, and duration of depression symptoms so that daily functioning is improved     Dates: Start:  06/30/23    Expected End:  12/31/23       Disciplines: Interdisciplinary, PROVIDER                  Goal: LTG: Increase coping skills to manage depression and improve ability to perform daily activities     Dates: Start:  06/30/23    Expected End:  12/31/23       Disciplines: Interdisciplinary, PROVIDER    ProgressTowards Goals: Progressing  Interventions: Motivational Interviewing  Summary: Alexandria [name] is a 28 y.o. female who presents with CPTSD and ADHD.   Suicidal/Homicidal: Nowithout intent/plan  Therapist Response: Alexandria [name] engaged well in individual virtual session with facilities manager. Clinician utilized MI OARS to reflect and summarize thoughts,  feelings, and interactions. Alexandria [name] shared updates on medication and noted some emotional breakthroughs that occurred with new medication for several days. Clinician processed the tearfulness and the level of concern about this emotional experience. Clinician noted that after a few days, the mood shifted and they were able to continue as usual.  Clinician discussed relationships and plans for the future. Clinician also noted the challenges in trying to make a life here when there is a plan to move away in the coming years.   Plan: Return again in 2 weeks.  Diagnosis: Chronic post-traumatic stress disorder (PTSD)  Attention deficit hyperactivity disorder (ADHD), combined type, severe  Collaboration of Care: Psychiatrist AEB reviewed note from Dr. Carvin  Patient/Guardian was advised Release of Information must be obtained prior to any record release in order to collaborate their care with an outside provider. Patient/Guardian was advised if they have not already done so to contact the registration department to sign all necessary forms in order for us  to release information regarding their care.   Consent: Patient/Guardian gives verbal consent for treatment and assignment of benefits for services provided during this visit. Patient/Guardian expressed understanding and agreed to proceed.   Alexandria JONELLE [place], LCSW 09/20/2024

## 2024-10-03 ENCOUNTER — Encounter (HOSPITAL_COMMUNITY): Payer: Self-pay | Admitting: Licensed Clinical Social Worker

## 2024-10-03 ENCOUNTER — Ambulatory Visit (INDEPENDENT_AMBULATORY_CARE_PROVIDER_SITE_OTHER): Admitting: Licensed Clinical Social Worker

## 2024-10-03 DIAGNOSIS — F840 Autistic disorder: Secondary | ICD-10-CM | POA: Diagnosis not present

## 2024-10-03 DIAGNOSIS — F4312 Post-traumatic stress disorder, chronic: Secondary | ICD-10-CM | POA: Diagnosis not present

## 2024-10-03 DIAGNOSIS — F902 Attention-deficit hyperactivity disorder, combined type: Secondary | ICD-10-CM | POA: Diagnosis not present

## 2024-10-03 NOTE — Progress Notes (Signed)
   THERAPIST PROGRESS NOTE  Session Time: 8:00am-8:55am  Participation Level: Active  Behavioral Response: NeatAlertAnxious and Depressed  Type of Therapy: Individual Therapy  Treatment Goals addressed:  Goal: LTG: Reduce frequency, intensity, and duration of depression symptoms so that daily functioning is improved     Dates: Start:  06/30/23    Expected End:  12/31/23       Disciplines: Interdisciplinary, PROVIDER                  Goal: LTG: Increase coping skills to manage depression and improve ability to perform daily activities     Dates: Start:  06/30/23    Expected End:  12/31/23       Disciplines: Interdisciplinary, PROVIDER    ProgressTowards Goals: Progressing  Interventions: Solution Focused and Supportive  Summary: Jla Vanderveen is a 28 y.o. female who presents with CPTSD, ADHD, Autism Spectrum Disorder Level 1.   Suicidal/Homicidal: Nowithout intent/plan some concern about self-harm. Safety plan in place  Therapist Response: [name] engaged well in individual in person session. Clinician utilized Supportive counseling and solution-focused therapy to process documentation from [facility] re: mental health dx. Clinician reflected [name]'s concerns and discomfort with the verbage used in the assessment, noting that it made her sound like a terrible person. Clinician reviewed documentation and assisted with interpretation. Clinician discussed the overall purpose of this evaluation, which was to get appropriately diagnosed in order to get medication management. Clinician reflected that this evaluation, while it sounds harsh, has done what it was meant to do, which was to trigger treatment regimen for ADHD, etc. Clinician discussed the different views of Autism Spectrum and provided psychoeducation about DSM-5TR diagnostic criteria.  [name] shared recent PTSD triggers and noted significant challenges in coping with a friend. Clinician reflected the sadness about the relationship  not working out, as well as the frustration that they continue to choose people with familiar patterns or behavior. Clinician identified this tendency due to familiarity. Clinician noted the that the difference in this relationship was that [name] was able to identify the pattern in record time, only 1 1/2 months. Clinician encouraged [name] to continue to be aware of their triggers and continue to observe their reactions to triggers.  Plan: Return again in 2 weeks.  Diagnosis: Chronic post-traumatic stress disorder (PTSD)  Attention deficit hyperactivity disorder (ADHD), combined type, severe  Autism spectrum disorder  Collaboration of Care: Patient refused AEB none required  Patient/Guardian was advised Release of Information must be obtained prior to any record release in order to collaborate their care with an outside provider. Patient/Guardian was advised if they have not already done so to contact the registration department to sign all necessary forms in order for us  to release information regarding their care.   Consent: Patient/Guardian gives verbal consent for treatment and assignment of benefits for services provided during this visit. Patient/Guardian expressed understanding and agreed to proceed.   [name] SAUNDERS [place], LCSW 10/03/2024

## 2024-10-19 ENCOUNTER — Ambulatory Visit (INDEPENDENT_AMBULATORY_CARE_PROVIDER_SITE_OTHER): Admitting: Licensed Clinical Social Worker

## 2024-10-19 ENCOUNTER — Encounter (HOSPITAL_COMMUNITY): Payer: Self-pay | Admitting: Licensed Clinical Social Worker

## 2024-10-19 DIAGNOSIS — F4312 Post-traumatic stress disorder, chronic: Secondary | ICD-10-CM
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
## 2024-11-14 ENCOUNTER — Encounter (HOSPITAL_COMMUNITY): Payer: Self-pay | Admitting: Psychiatry

## 2024-11-14 ENCOUNTER — Telehealth (HOSPITAL_COMMUNITY): Payer: PRIVATE HEALTH INSURANCE | Admitting: Psychiatry

## 2024-11-14 DIAGNOSIS — F840 Autistic disorder: Secondary | ICD-10-CM

## 2024-11-14 DIAGNOSIS — F4312 Post-traumatic stress disorder, chronic: Secondary | ICD-10-CM | POA: Diagnosis not present

## 2024-11-14 DIAGNOSIS — R110 Nausea: Secondary | ICD-10-CM | POA: Diagnosis not present

## 2024-11-14 DIAGNOSIS — F902 Attention-deficit hyperactivity disorder, combined type: Secondary | ICD-10-CM | POA: Diagnosis not present

## 2024-11-14 MED ORDER — ONDANSETRON 4 MG PO TBDP: 4.0000 mg | ORAL_TABLET | Freq: Three times a day (TID) | ORAL | 0 refills | Status: AC | PRN

## 2024-11-14 MED ORDER — SERTRALINE HCL 25 MG PO TABS: 25.0000 mg | ORAL_TABLET | Freq: Every day | ORAL | 2 refills | Status: DC

## 2024-11-14 MED ORDER — AMPHETAMINE-DEXTROAMPHETAMINE 10 MG PO TABS: 10.0000 mg | ORAL_TABLET | Freq: Every day | ORAL | 0 refills | Status: DC

## 2024-11-14 MED ORDER — AMPHETAMINE-DEXTROAMPHET ER 20 MG PO CP24: 20.0000 mg | ORAL_CAPSULE | Freq: Every day | ORAL | 0 refills | Status: DC

## 2024-11-15 ENCOUNTER — Ambulatory Visit (INDEPENDENT_AMBULATORY_CARE_PROVIDER_SITE_OTHER): Payer: PRIVATE HEALTH INSURANCE | Admitting: Licensed Clinical Social Worker

## 2024-11-15 ENCOUNTER — Encounter (HOSPITAL_COMMUNITY): Payer: Self-pay

## 2024-11-15 DIAGNOSIS — F4312 Post-traumatic stress disorder, chronic: Secondary | ICD-10-CM

## 2024-11-15 NOTE — Progress Notes (Signed)
 Virtual Visit via Video Note  I connected with Alexandria [name] on 11/15/2024 at 11:00 AM EST by a video enabled telemedicine application and verified that I am speaking with the correct person using two identifiers.  Location: Patient: home Provider: home office   I discussed the limitations of evaluation and management by telemedicine and the availability of in person appointments. The patient expressed understanding and agreed to proceed.   I discussed the assessment and treatment plan with the patient. The patient was provided an opportunity to ask questions and all were answered. The patient agreed with the plan and demonstrated an understanding of the instructions.   The patient was advised to call back or seek an in-person evaluation if the symptoms worsen or if the condition fails to improve as anticipated.  I provided 55 minutes of non-face-to-face time during this encounter.   [name], LCSW   THERAPIST PROGRESS NOTE  Session Time: 11:00am- 11:55am  Participation Level: Active  Behavioral Response: CasualAlertEuthymic  Type of Therapy: Individual Therapy  Treatment Goals addressed:  Active     BH CCP Acute or Chronic Trauma Reaction     LTG: Recall traumatic events without becoming overwhelmed with negative emotions (Progressing)     Start:  06/30/23    Expected End:  12/31/23         STG: Alexandria [name] will verbalize an increased sense of mastery over PTSD symptoms by using several techniques to cope with flashbacks, decrease the power of triggers, and decrease negative thinking (Progressing)     Start:  06/30/23    Expected End:  12/31/23         STG: Alexandria [name] will cooperate with a medication evaluation by accurately reporting symptoms, if present (Progressing)     Start:  06/30/23    Expected End:  12/31/23         STG: Alexandria [name] will cooperate with treatment in an effort to reduce PHQ-9 assessment scores (Progressing)     Start:   06/30/23    Expected End:  12/31/23           OP Depression     LTG: Reduce frequency, intensity, and duration of depression symptoms so that daily functioning is improved (Progressing)     Start:  06/30/23    Expected End:  12/31/23         LTG: Increase coping skills to manage depression and improve ability to perform daily activities (Progressing)     Start:  06/30/23    Expected End:  12/31/23         STG: Alexandria [name] will identify cognitive patterns and beliefs that support depression (Progressing)     Start:  06/30/23    Expected End:  12/31/23         Work with Alexandria [name] to track symptoms, triggers, and/or skill use through a mood chart, diary card, or journal     Start:  06/30/23         Therapist will educate patient on cognitive distortions and the rationale for treatment of depression     Start:  06/30/23         Create a weekly activity schedule     Start:  06/30/23            ProgressTowards Goals: Progressing  Interventions: Motivational Interviewing and Solution Focused  Summary: Alexandria [name] is a 29 y.o. female who presents with CPTSD.   Suicidal/Homicidal: Nowithout intent/plan  Therapist Response: [name] engaged well in individual  virtual session with clinician. Clinician utilized MI and Solution Focused therapy to process thoughts, feelings, and interactions. Clinician explored socialization, as well as obsessive thought processes about the future. Clinician identified family hx of anxiety and OCD. Clinician provided psychoeducation about previous understanding of OCD as an anxiety disorder, which is helpful to understand sxs. Clinician reflected challenges remembering certain tasks being completed, such as closing the bedroom door to keep her cats separated, or turning electrical items off completely. Clinician validated the real concerns that become fixations with OCD. Clinician provided feedback about ways to remember those important  things. Alexandria [name] shared that they have been taking pictures of the closed bedroom door to help relax and reassure them.   Plan: Return again in 2 weeks.  Diagnosis: Chronic post-traumatic stress disorder (PTSD)  Collaboration of Care: Patient refused AEB none required  Patient/Guardian was advised Release of Information must be obtained prior to any record release in order to collaborate their care with an outside provider. Patient/Guardian was advised if they have not already done so to contact the registration department to sign all necessary forms in order for us  to release information regarding their care.   Consent: Patient/Guardian gives verbal consent for treatment and assignment of benefits for services provided during this visit. Patient/Guardian expressed understanding and agreed to proceed.   [name] SAUNDERS [place], LCSW 11/15/2024

## 2024-11-16 ENCOUNTER — Encounter (HOSPITAL_COMMUNITY): Payer: Self-pay | Admitting: Licensed Clinical Social Worker

## 2024-11-28 ENCOUNTER — Ambulatory Visit (INDEPENDENT_AMBULATORY_CARE_PROVIDER_SITE_OTHER): Payer: Self-pay | Admitting: Licensed Clinical Social Worker

## 2024-11-28 DIAGNOSIS — F4312 Post-traumatic stress disorder, chronic: Secondary | ICD-10-CM

## 2024-11-29 ENCOUNTER — Encounter (HOSPITAL_COMMUNITY): Payer: Self-pay | Admitting: Licensed Clinical Social Worker

## 2024-11-29 NOTE — Progress Notes (Signed)
 Virtual Visit via Video Note  I connected with Alexandria [name] on 11/28/24 at 12:30 PM EST by a video enabled telemedicine application and verified that I am speaking with the correct person using two identifiers.  Location: Patient: home Provider: home office   I discussed the limitations of evaluation and management by telemedicine and the availability of in person appointments. The patient expressed understanding and agreed to proceed.    I discussed the assessment and treatment plan with the patient. The patient was provided an opportunity to ask questions and all were answered. The patient agreed with the plan and demonstrated an understanding of the instructions.   The patient was advised to call back or seek an in-person evaluation if the symptoms worsen or if the condition fails to improve as anticipated.  I provided 55 minutes of non-face-to-face time during this encounter.   Alexandria JONELLE Rosser, LCSW   THERAPIST PROGRESS NOTE  Session Time: 12:30pm-1:25pm  Participation Level: Active  Behavioral Response: NeatAlertAnxious and Dysphoric  Type of Therapy: Individual Therapy  Treatment Goals addressed:  Active     BH CCP Acute or Chronic Trauma Reaction     LTG: Recall traumatic events without becoming overwhelmed with negative emotions (Progressing)     Start:  06/30/23    Expected End:  11/27/25         STG: Alexandria [name] will verbalize an increased sense of mastery over PTSD symptoms by using several techniques to cope with flashbacks, decrease the power of triggers, and decrease negative thinking (Progressing)     Start:  06/30/23    Expected End:  11/27/25         STG: Alexandria [name] will cooperate with a medication evaluation by accurately reporting symptoms, if present (Progressing)     Start:  06/30/23    Expected End:  11/27/25         STG: Alexandria [name] will cooperate with treatment in an effort to reduce PHQ-9 assessment scores (Progressing)      Start:  06/30/23    Expected End:  11/27/25           OP Depression     LTG: Reduce frequency, intensity, and duration of depression symptoms so that daily functioning is improved (Progressing)     Start:  06/30/23    Expected End:  11/27/25         LTG: Increase coping skills to manage depression and improve ability to perform daily activities (Progressing)     Start:  06/30/23    Expected End:  11/27/25         STG: Alexandria [name] will identify cognitive patterns and beliefs that support depression (Progressing)     Start:  06/30/23    Expected End:  11/27/25         Work with Alexandria [name] to track symptoms, triggers, and/or skill use through a mood chart, diary card, or journal     Start:  06/30/23         Therapist will educate patient on cognitive distortions and the rationale for treatment of depression     Start:  06/30/23         Create a weekly activity schedule     Start:  06/30/23            ProgressTowards Goals: Progressing  Interventions: Motivational Interviewing and Solution Focused  Summary: Alexandria [name] is a 29 y.o. female who presents with Chronic PTSD.   Suicidal/Homicidal: Nowithout intent/plan  Therapist Response: [name] engaged  well in individual virtual session with clinician. Clinician utilized MI and Solution Focused therapy to process current mood, interactions, and thought processes. Clinician explored ongoing feeling of stuckness in her trauma, noting that even happy memories bring her back to traumatic times. Clinician discussed thought stopping and ways to move themself through the darkness into the light. Alexandria [name] shared that this past year has been the first in many where they were not in a traumatic relationship. Clinician identified the improvement in coping skills overall. However, Alexandria [name] also shared increase in OCD sxs. Clinician provided psychoeducation about the connection between feeling uneasy or traumatized and then  seeking compulsive behaviors for control.  Clinician discussed transfer to another therapist for EMDR.   Plan: Return again in 2 weeks. Referred Alexandria [name] to [facility] for EMDR treatment. Alexandria [name] agreed and front desk staff will reach out to set appt.  Diagnosis: Chronic post-traumatic stress disorder (PTSD)  Collaboration of Care: Referral or follow-up with counselor/therapist AEB refer for EMDR  Patient/Guardian was advised Release of Information must be obtained prior to any record release in order to collaborate their care with an outside provider. Patient/Guardian was advised if they have not already done so to contact the registration department to sign all necessary forms in order for us  to release information regarding their care.   Consent: Patient/Guardian gives verbal consent for treatment and assignment of benefits for services provided during this visit. Patient/Guardian expressed understanding and agreed to proceed.   Alexandria SAUNDERS [place], LCSW 11/29/2024

## 2024-12-07 ENCOUNTER — Ambulatory Visit (HOSPITAL_COMMUNITY): Payer: PRIVATE HEALTH INSURANCE | Admitting: Psychiatry

## 2024-12-07 ENCOUNTER — Encounter (HOSPITAL_COMMUNITY): Payer: Self-pay | Admitting: Psychiatry

## 2024-12-07 VITALS — BP 117/81 | HR 73 | Ht 62.75 in | Wt 120.0 lb

## 2024-12-07 DIAGNOSIS — F840 Autistic disorder: Secondary | ICD-10-CM

## 2024-12-07 DIAGNOSIS — Z79899 Other long term (current) drug therapy: Secondary | ICD-10-CM | POA: Diagnosis not present

## 2024-12-07 DIAGNOSIS — F902 Attention-deficit hyperactivity disorder, combined type: Secondary | ICD-10-CM | POA: Diagnosis not present

## 2024-12-07 DIAGNOSIS — F4312 Post-traumatic stress disorder, chronic: Secondary | ICD-10-CM | POA: Diagnosis not present

## 2024-12-07 MED ORDER — SERTRALINE HCL 50 MG PO TABS: 50.0000 mg | ORAL_TABLET | Freq: Every day | ORAL | 2 refills | Status: AC

## 2024-12-07 MED ORDER — AMPHETAMINE-DEXTROAMPHET ER 20 MG PO CP24: 20.0000 mg | ORAL_CAPSULE | Freq: Every day | ORAL | 0 refills | Status: AC

## 2024-12-07 MED ORDER — AMPHETAMINE-DEXTROAMPHETAMINE 10 MG PO TABS: 10.0000 mg | ORAL_TABLET | Freq: Every day | ORAL | 0 refills | Status: AC

## 2024-12-12 ENCOUNTER — Ambulatory Visit (HOSPITAL_COMMUNITY): Payer: PRIVATE HEALTH INSURANCE | Admitting: Licensed Clinical Social Worker

## 2024-12-26 ENCOUNTER — Ambulatory Visit (HOSPITAL_COMMUNITY): Payer: PRIVATE HEALTH INSURANCE | Admitting: Licensed Clinical Social Worker

## 2025-01-09 ENCOUNTER — Ambulatory Visit: Payer: Self-pay

## 2025-01-10 ENCOUNTER — Ambulatory Visit (HOSPITAL_COMMUNITY): Admitting: Psychiatry
